# Patient Record
Sex: Female | Born: 1975 | Race: Asian | Hispanic: No | Marital: Married | Smoking: Never smoker
Health system: Southern US, Community
[De-identification: ages and names within clinical notes are randomized; demographics above are authoritative.]

## PROBLEM LIST (undated history)

## (undated) DIAGNOSIS — G5603 Carpal tunnel syndrome, bilateral upper limbs: Secondary | ICD-10-CM

## (undated) DIAGNOSIS — N841 Polyp of cervix uteri: Secondary | ICD-10-CM

## (undated) DIAGNOSIS — R519 Headache, unspecified: Secondary | ICD-10-CM

## (undated) DIAGNOSIS — F32A Depression, unspecified: Secondary | ICD-10-CM

## (undated) DIAGNOSIS — J45909 Unspecified asthma, uncomplicated: Secondary | ICD-10-CM

## (undated) DIAGNOSIS — G43909 Migraine, unspecified, not intractable, without status migrainosus: Secondary | ICD-10-CM

## (undated) DIAGNOSIS — M1990 Unspecified osteoarthritis, unspecified site: Secondary | ICD-10-CM

## (undated) DIAGNOSIS — R7309 Other abnormal glucose: Secondary | ICD-10-CM

## (undated) DIAGNOSIS — O24419 Gestational diabetes mellitus in pregnancy, unspecified control: Secondary | ICD-10-CM

## (undated) DIAGNOSIS — G5621 Lesion of ulnar nerve, right upper limb: Secondary | ICD-10-CM

## (undated) DIAGNOSIS — D649 Anemia, unspecified: Secondary | ICD-10-CM

## (undated) DIAGNOSIS — T8859XA Other complications of anesthesia, initial encounter: Secondary | ICD-10-CM

## (undated) DIAGNOSIS — E7800 Pure hypercholesterolemia, unspecified: Secondary | ICD-10-CM

## (undated) DIAGNOSIS — O139 Gestational [pregnancy-induced] hypertension without significant proteinuria, unspecified trimester: Secondary | ICD-10-CM

## (undated) DIAGNOSIS — N979 Female infertility, unspecified: Secondary | ICD-10-CM

## (undated) DIAGNOSIS — F419 Anxiety disorder, unspecified: Secondary | ICD-10-CM

## (undated) DIAGNOSIS — K219 Gastro-esophageal reflux disease without esophagitis: Secondary | ICD-10-CM

## (undated) HISTORY — DX: Gestational diabetes mellitus in pregnancy, unspecified control: O24.419

## (undated) HISTORY — DX: Other abnormal glucose: R73.09

## (undated) HISTORY — PX: PILONIDAL CYST EXCISION: SHX744

## (undated) HISTORY — DX: Headache, unspecified: R51.9

## (undated) HISTORY — DX: Pure hypercholesterolemia, unspecified: E78.00

## (undated) HISTORY — DX: Migraine, unspecified, not intractable, without status migrainosus: G43.909

## (undated) HISTORY — DX: Gastro-esophageal reflux disease without esophagitis: K21.9

## (undated) HISTORY — DX: Depression, unspecified: F32.A

## (undated) HISTORY — DX: Anxiety disorder, unspecified: F41.9

## (undated) HISTORY — DX: Unspecified asthma, uncomplicated: J45.909

## (undated) HISTORY — PX: OTHER SURGICAL HISTORY: SHX169

---

## 2007-09-23 HISTORY — PX: LAPAROSCOPIC CHOLECYSTECTOMY: SUR755

## 2012-09-22 HISTORY — PX: ELBOW ARTHROSCOPY: SUR87

## 2013-09-22 HISTORY — PX: ELBOW ARTHROSCOPY: SUR87

## 2015-08-13 ENCOUNTER — Ambulatory Visit: Payer: Self-pay | Admitting: Physician Assistant

## 2015-08-13 ENCOUNTER — Encounter: Payer: Self-pay | Admitting: Physician Assistant

## 2015-08-13 VITALS — BP 122/82 | HR 78 | Temp 98.6°F

## 2015-08-13 DIAGNOSIS — J069 Acute upper respiratory infection, unspecified: Secondary | ICD-10-CM

## 2015-08-13 MED ORDER — AZITHROMYCIN 250 MG PO TABS: ORAL_TABLET | ORAL | Status: DC

## 2015-08-13 MED ORDER — HYDROCOD POLST-CPM POLST ER 10-8 MG/5ML PO SUER: 5.0000 mL | Freq: Two times a day (BID) | ORAL | Status: DC | PRN

## 2015-08-13 NOTE — Progress Notes (Signed)
S: C/o runny nose and congestion for 3 days, sore throat, cough which is keeping her up,  no fever, chills, cp/sob, v/d; mucus was green this am but clear throughout the day, cough is sporadic,   Using otc meds without relief  O: PE: perrl eomi, normocephalic, tms dull, nasal mucosa red and swollen, throat injected, neck supple no lymph, lungs c t a, cv rrr, neuro intact  A:  Acute  uri   P: zpack, tussionex 179ml nrdrink fluids, continue regular meds , use otc meds of choice, return if not improving in 5 days, return earlier if worsening

## 2015-09-15 ENCOUNTER — Emergency Department
Admission: EM | Admit: 2015-09-15 | Discharge: 2015-09-15 | Disposition: A | Discharge status: Home / Self Care | Payer: 59 | Attending: Emergency Medicine | Admitting: Emergency Medicine

## 2015-09-15 ENCOUNTER — Encounter: Payer: Self-pay | Admitting: Emergency Medicine

## 2015-09-15 DIAGNOSIS — O209 Hemorrhage in early pregnancy, unspecified: Secondary | ICD-10-CM | POA: Diagnosis present

## 2015-09-15 DIAGNOSIS — Z79899 Other long term (current) drug therapy: Secondary | ICD-10-CM | POA: Insufficient documentation

## 2015-09-15 DIAGNOSIS — O039 Complete or unspecified spontaneous abortion without complication: Secondary | ICD-10-CM | POA: Insufficient documentation

## 2015-09-15 DIAGNOSIS — Z7951 Long term (current) use of inhaled steroids: Secondary | ICD-10-CM | POA: Insufficient documentation

## 2015-09-15 DIAGNOSIS — Z3A01 Less than 8 weeks gestation of pregnancy: Secondary | ICD-10-CM | POA: Insufficient documentation

## 2015-09-15 LAB — URINALYSIS COMPLETE WITH MICROSCOPIC (ARMC ONLY)
BILIRUBIN URINE: NEGATIVE
GLUCOSE, UA: NEGATIVE mg/dL
Ketones, ur: NEGATIVE mg/dL
LEUKOCYTES UA: NEGATIVE
NITRITE: NEGATIVE
PH: 6 (ref 5.0–8.0)
Protein, ur: NEGATIVE mg/dL
Specific Gravity, Urine: 1.01 (ref 1.005–1.030)

## 2015-09-15 LAB — CBC WITH DIFFERENTIAL/PLATELET
BASOS PCT: 1 %
Basophils Absolute: 0 10*3/uL (ref 0–0.1)
Eosinophils Absolute: 0.2 10*3/uL (ref 0–0.7)
Eosinophils Relative: 2 %
HEMATOCRIT: 34 % — AB (ref 35.0–47.0)
HEMOGLOBIN: 11.4 g/dL — AB (ref 12.0–16.0)
Lymphocytes Relative: 19 %
Lymphs Abs: 1.3 10*3/uL (ref 1.0–3.6)
MCH: 26.7 pg (ref 26.0–34.0)
MCHC: 33.6 g/dL (ref 32.0–36.0)
MCV: 79.4 fL — ABNORMAL LOW (ref 80.0–100.0)
MONOS PCT: 8 %
Monocytes Absolute: 0.6 10*3/uL (ref 0.2–0.9)
NEUTROS ABS: 4.9 10*3/uL (ref 1.4–6.5)
NEUTROS PCT: 70 %
Platelets: 366 10*3/uL (ref 150–440)
RBC: 4.29 MIL/uL (ref 3.80–5.20)
RDW: 15.1 % — ABNORMAL HIGH (ref 11.5–14.5)
WBC: 7 10*3/uL (ref 3.6–11.0)

## 2015-09-15 LAB — BASIC METABOLIC PANEL
ANION GAP: 6 (ref 5–15)
BUN: 12 mg/dL (ref 6–20)
CHLORIDE: 108 mmol/L (ref 101–111)
CO2: 25 mmol/L (ref 22–32)
CREATININE: 0.66 mg/dL (ref 0.44–1.00)
Calcium: 9 mg/dL (ref 8.9–10.3)
GFR calc non Af Amer: >60 mL/min (ref >60)
Glucose, Bld: 102 mg/dL — ABNORMAL HIGH (ref 65–99)
POTASSIUM: 4.1 mmol/L (ref 3.5–5.1)
Sodium: 139 mmol/L (ref 135–145)

## 2015-09-15 LAB — ABO/RH: ABO/RH(D): A POS

## 2015-09-15 LAB — HCG, QUANTITATIVE, PREGNANCY: hCG, Beta Chain, Quant, S: 240 m[IU]/mL — ABNORMAL HIGH (ref <5)

## 2015-09-15 NOTE — Discharge Instructions (Signed)
Your pregnancy hormone level was 240. Your blood type is a positive. Your pee or to be having a miscarriage. We will continue to have some bleeding and cramping. He may take ibuprofen for cramping if needed. Follow-up with [facility] clinic this coming week. Return to the emergency department if you have heavier bleeding or uncontrolled pain or other urgent concerns.  Miscarriage A miscarriage is the sudden loss of an unborn baby (fetus) before the 20th week of pregnancy. Most miscarriages happen in the first 3 months of pregnancy. Sometimes, it happens before a woman even knows she is pregnant. A miscarriage is also called a "spontaneous miscarriage" or "early pregnancy loss." Having a miscarriage can be an emotional experience. Talk with your caregiver about any questions you may have about miscarrying, the grieving process, and your future pregnancy plans. CAUSES   Problems with the fetal chromosomes that make it impossible for the baby to develop normally. Problems with the baby's genes or chromosomes are most often the result of errors that occur, by chance, as the embryo divides and grows. The problems are not inherited from the parents.  Infection of the cervix or uterus.   Hormone problems.   Problems with the cervix, such as having an incompetent cervix. This is when the tissue in the cervix is not strong enough to hold the pregnancy.   Problems with the uterus, such as an abnormally shaped uterus, uterine fibroids, or congenital abnormalities.   Certain medical conditions.   Smoking, drinking alcohol, or taking illegal drugs.   Trauma.  Often, the cause of a miscarriage is unknown.  SYMPTOMS   Vaginal bleeding or spotting, with or without cramps or pain.  Pain or cramping in the abdomen or lower back.  Passing fluid, tissue, or blood clots from the vagina. DIAGNOSIS  Your caregiver will perform a physical exam. You may also have an ultrasound to confirm the miscarriage.  Blood or urine tests may also be ordered. TREATMENT   Sometimes, treatment is not necessary if you naturally pass all the fetal tissue that was in the uterus. If some of the fetus or placenta remains in the body (incomplete miscarriage), tissue left behind may become infected and must be removed. Usually, a dilation and curettage (D and C) procedure is performed. During a D and C procedure, the cervix is widened (dilated) and any remaining fetal or placental tissue is gently removed from the uterus.  Antibiotic medicines are prescribed if there is an infection. Other medicines may be given to reduce the size of the uterus (contract) if there is a lot of bleeding.  If you have Rh negative blood and your baby was Rh positive, you will need a Rh immunoglobulin shot. This shot will protect any future baby from having Rh blood problems in future pregnancies. HOME CARE INSTRUCTIONS   Your caregiver may order bed rest or may allow you to continue light activity. Resume activity as directed by your caregiver.  Have someone help with home and family responsibilities during this time.   Keep track of the number of sanitary pads you use each day and how soaked (saturated) they are. Write down this information.   Do not use tampons. Do not douche or have sexual intercourse until approved by your caregiver.   Only take over-the-counter or prescription medicines for pain or discomfort as directed by your caregiver.   Do not take aspirin. Aspirin can cause bleeding.   Keep all follow-up appointments with your caregiver.   If you or  your partner have problems with grieving, talk to your caregiver or seek counseling to help cope with the pregnancy loss. Allow enough time to grieve before trying to get pregnant again.  SEEK IMMEDIATE MEDICAL CARE IF:   You have severe cramps or pain in your back or abdomen.  You have a fever.  You pass large blood clots (walnut-sized or larger) ortissue from  your vagina. Save any tissue for your caregiver to inspect.   Your bleeding increases.   You have a thick, bad-smelling vaginal discharge.  You become lightheaded, weak, or you faint.   You have chills.  MAKE SURE YOU:  Understand these instructions.  Will watch your condition.  Will get help right away if you are not doing well or get worse.   This information is not intended to replace advice given to you by your health care provider. Make sure you discuss any questions you have with your health care provider.   Document Released: 03/04/2001 Document Revised: 01/03/2013 Document Reviewed: 10/28/2011 Elsevier Interactive Patient Education [employer].

## 2015-09-15 NOTE — ED Notes (Signed)
Pt states she is 3-[redacted] weeks pregnant and started having vaginal bleeding with lower abd. Cramping this am

## 2015-09-15 NOTE — ED Provider Notes (Signed)
[facility] Emergency Department Provider Note  ____________________________________________  Time seen: 8:30 AM  I have reviewed the triage vital signs and the nursing notes.  History by:  Patient and her husband  HISTORY  Chief Complaint Vaginal Bleeding     HPI Donna Fowler is a 39 y.o. female who resents emergency department with vaginal bleeding and report that she is 3-4 weeks. Her last menstrual period was November 10, which would give a gestational age of approximately 38 weeks, however she was seen at [facility] on Thursday due to spotting with this current pregnancy. At that time, she had an hCG level drawn which was approximately 370 and she had an ultrasound. She was told that the ultrasound and hCG gave an estimated gestational age of 3-4 weeks. This morning, she had increased bleeding, the same as menstrual period, with some cramping which has been intermittent. The patient is understandably fearful that she is having a miscarriage.    History reviewed. No pertinent past medical history.  There are no active problems to display for this patient.   Past Surgical History  Procedure Laterality Date  . Cholecystectomy      Current Outpatient Rx  Name  Route  Sig  Dispense  Refill  . albuterol (PROVENTIL HFA;VENTOLIN HFA) 108 (90 BASE) MCG/ACT inhaler   Inhalation   Inhale into the lungs every 6 (six) hours as needed for wheezing or shortness of breath.         . ALPRAZolam (XANAX) 0.25 MG tablet   Oral   Take by mouth.         Marland Kitchen atorvastatin (LIPITOR) 10 MG tablet   Oral   Take 10 mg by mouth daily.         Marland Kitchen azithromycin (ZITHROMAX Z-PAK) 250 MG tablet      2 pills today then 1 pill qd for 4 d   6 each   0     rx written   . cetirizine (ZYRTEC) 5 MG tablet   Oral   Take 5 mg by mouth daily.         . chlorpheniramine-HYDROcodone (TUSSIONEX PENNKINETIC ER) 10-8 MG/5ML SUER   Oral   Take 5 mLs by mouth every 12  (twelve) hours as needed for cough.   150 mL   0     rx written   . FLUoxetine (PROZAC) 10 MG capsule   Oral   Take 10 mg by mouth daily.         . fluticasone (FLONASE) 50 MCG/ACT nasal spray   Nasal   Place into the nose.         . pantoprazole (PROTONIX) 20 MG tablet   Oral   Take 20 mg by mouth daily.         Marland Kitchen topiramate (TOPAMAX) 25 MG tablet   Oral   Take 25 mg by mouth 2 (two) times daily.           Allergies Review of patient's allergies indicates no known allergies.  No family history on file.  Social History Social History  Substance Use Topics  . Smoking status: Never Smoker   . Smokeless tobacco: None  . Alcohol Use: No    Review of Systems  Constitutional: Negative for fever/chills. ENT: Negative for congestion. Cardiovascular: Negative for chest pain. Respiratory: Negative for cough. Gastrointestinal: Negative for abdominal pain, vomiting and diarrhea. Genitourinary: 6 weeks gravid by dates, vaginal bleeding. See history of present illness Musculoskeletal: No back pain.  Skin: Negative for rash. Neurological: Negative for headache or focal weakness   10-point ROS otherwise negative.  ____________________________________________   PHYSICAL EXAM:  VITAL SIGNS: ED Triage Vitals  Enc Vitals Group     BP 09/15/15 0713 134/86 mmHg     Pulse Rate 09/15/15 0713 81     Resp 09/15/15 0713 18     Temp 09/15/15 0713 98 F (36.7 C)     Temp Source 09/15/15 0713 Oral     SpO2 09/15/15 0713 100 %     Weight 09/15/15 0713 180 lb (81.647 kg)     Height 09/15/15 0713 5\' 2"  (1.575 m)     Head Cir --      Peak Flow --      Pain Score 09/15/15 0717 7     Pain Loc --      Pain Edu? --      Excl. in [place]?Well appearing and in no distress --     Constitutional:  Alert and oriented. Well appearing, but tearful, and in no distress. ENT   Head: Normocephalic and atraumatic. Cardiovascular: Normal rate, regular rhythm, no murmur  noted Respiratory:  Normal respiratory effort, no tachypnea.    Breath sounds are clear and equal bilaterally.  Gastrointestinal: Soft, no distention. Nontender Musculoskeletal: No deformity noted. Nontender with normal range of motion in all extremities.  No noted edema. Neurologic:  Communicative. Normal appearing spontaneous movement in all 4 extremities. No gross focal neurologic deficits are appreciated.  Skin:  Skin is warm, dry. No rash noted. Psychiatric: Mood and affect are normal. Speech and behavior are normal.  ____________________________________________    LABS (pertinent positives/negatives)  Labs Reviewed  HCG, QUANTITATIVE, PREGNANCY - Abnormal; Notable for the following:    hCG, Beta Chain, Quant, S 240 (*)    All other components within normal limits  URINALYSIS COMPLETEWITH MICROSCOPIC ([facility] ONLY) - Abnormal; Notable for the following:    Color, Urine STRAW (*)    APPearance CLEAR (*)    Hgb urine dipstick 3+ (*)    Bacteria, UA RARE (*)    Squamous Epithelial / LPF 0-5 (*)    All other components within normal limits  CBC WITH DIFFERENTIAL/PLATELET  BASIC METABOLIC PANEL  ABO/RH     ____________________________________________ ____________________________________________   INITIAL IMPRESSION / ASSESSMENT AND PLAN / ED COURSE  Pertinent labs & imaging results that were available during my care of the patient were reviewed by me and considered in my medical decision making (see chart for details).  39 year old female with suspected miscarriage, or at least a threatened miscarriage. She had an hCG level of 370 at [facility] on Thursday. She had an ultrasound that showed a gestational sac with a gestational age of 3-4 weeks.  Laboratory results show an hCG level today of 240. She has a blood type of a positive.  I have counseled the patient on the significance of the hCG level and the likelihood that this is a miscarriage, consistent with her current  symptoms. She is thoughtful, tearful, and appropriate for the circumstance. She understands the diagnosis.  Given that the patient just had an ultrasound on Thursday, I do not see an indication for repeat ultrasound. I've offered one and discussed this with the patient, but she and her husband agree.  ____________________________________________   FINAL CLINICAL IMPRESSION(S) / ED DIAGNOSES  Final diagnoses:  Miscarriage      Ahmed Prima, MD 09/15/15 [phone]

## 2015-09-15 NOTE — ED Notes (Signed)
MD at bedside. 

## 2015-09-23 HISTORY — PX: COLONOSCOPY WITH ESOPHAGOGASTRODUODENOSCOPY (EGD): SHX5779

## 2015-09-25 DIAGNOSIS — N840 Polyp of corpus uteri: Secondary | ICD-10-CM | POA: Diagnosis not present

## 2015-09-25 DIAGNOSIS — O200 Threatened abortion: Secondary | ICD-10-CM | POA: Diagnosis not present

## 2015-11-07 DIAGNOSIS — K219 Gastro-esophageal reflux disease without esophagitis: Secondary | ICD-10-CM | POA: Diagnosis not present

## 2015-11-07 DIAGNOSIS — E782 Mixed hyperlipidemia: Secondary | ICD-10-CM | POA: Diagnosis not present

## 2015-11-07 DIAGNOSIS — Z6834 Body mass index (BMI) 34.0-34.9, adult: Secondary | ICD-10-CM | POA: Diagnosis not present

## 2015-11-07 DIAGNOSIS — J309 Allergic rhinitis, unspecified: Secondary | ICD-10-CM | POA: Diagnosis not present

## 2015-12-26 ENCOUNTER — Ambulatory Visit: Payer: Self-pay | Admitting: Physician Assistant

## 2015-12-26 ENCOUNTER — Encounter: Payer: Self-pay | Admitting: Physician Assistant

## 2015-12-26 VITALS — BP 120/82 | HR 76 | Temp 98.7°F

## 2015-12-26 DIAGNOSIS — J069 Acute upper respiratory infection, unspecified: Secondary | ICD-10-CM

## 2015-12-26 MED ORDER — CEFDINIR 300 MG PO CAPS: 300.0000 mg | ORAL_CAPSULE | Freq: Two times a day (BID) | ORAL | Status: DC

## 2015-12-26 MED ORDER — HYDROCOD POLST-CPM POLST ER 10-8 MG/5ML PO SUER: 5.0000 mL | Freq: Two times a day (BID) | ORAL | Status: DC | PRN

## 2015-12-26 MED ORDER — BENZONATATE 200 MG PO CAPS: 200.0000 mg | ORAL_CAPSULE | Freq: Three times a day (TID) | ORAL | Status: DC | PRN

## 2015-12-26 NOTE — Progress Notes (Signed)
S: C/o cough and congestion with green mucus,  chest is sore from coughing, denies  fever, chills, states  cough is  keeping her awake at night;  denies cardiac type chest pain or sob, v/d, abd pain Remainder ros neg, sx for 4 days  O: vitals wnl, nad, tms clear, throat injected, neck supple no lymph, lungs c t a, cv rrr, cough is dry and hacking,  neuro intact  A:  Acute bronchitis   P:  rx medication: omnicef, tussionex for night time cough, tessalon perls during the day while at work, use otc meds, tylenol or motrin as needed for fever/chills, return if not better in 3 -5 days, return earlier if worsening

## 2015-12-31 DIAGNOSIS — D259 Leiomyoma of uterus, unspecified: Secondary | ICD-10-CM | POA: Diagnosis not present

## 2015-12-31 DIAGNOSIS — R5383 Other fatigue: Secondary | ICD-10-CM | POA: Diagnosis not present

## 2015-12-31 DIAGNOSIS — N83209 Unspecified ovarian cyst, unspecified side: Secondary | ICD-10-CM | POA: Diagnosis not present

## 2015-12-31 DIAGNOSIS — N840 Polyp of corpus uteri: Secondary | ICD-10-CM | POA: Diagnosis not present

## 2016-01-07 DIAGNOSIS — N970 Female infertility associated with anovulation: Secondary | ICD-10-CM | POA: Diagnosis not present

## 2016-01-07 DIAGNOSIS — N840 Polyp of corpus uteri: Secondary | ICD-10-CM | POA: Diagnosis not present

## 2016-01-25 DIAGNOSIS — N970 Female infertility associated with anovulation: Secondary | ICD-10-CM | POA: Diagnosis not present

## 2016-02-05 DIAGNOSIS — H524 Presbyopia: Secondary | ICD-10-CM | POA: Diagnosis not present

## 2016-02-05 DIAGNOSIS — H5213 Myopia, bilateral: Secondary | ICD-10-CM | POA: Diagnosis not present

## 2016-02-12 DIAGNOSIS — N970 Female infertility associated with anovulation: Secondary | ICD-10-CM | POA: Diagnosis not present

## 2016-02-26 DIAGNOSIS — N978 Female infertility of other origin: Secondary | ICD-10-CM | POA: Diagnosis not present

## 2016-02-26 DIAGNOSIS — E039 Hypothyroidism, unspecified: Secondary | ICD-10-CM | POA: Diagnosis not present

## 2016-03-26 DIAGNOSIS — E039 Hypothyroidism, unspecified: Secondary | ICD-10-CM | POA: Diagnosis not present

## 2016-04-01 DIAGNOSIS — N979 Female infertility, unspecified: Secondary | ICD-10-CM | POA: Diagnosis not present

## 2016-04-08 DIAGNOSIS — N979 Female infertility, unspecified: Secondary | ICD-10-CM | POA: Diagnosis not present

## 2016-04-09 DIAGNOSIS — Z1231 Encounter for screening mammogram for malignant neoplasm of breast: Secondary | ICD-10-CM | POA: Diagnosis not present

## 2016-04-09 DIAGNOSIS — F411 Generalized anxiety disorder: Secondary | ICD-10-CM | POA: Diagnosis not present

## 2016-04-09 DIAGNOSIS — Z01419 Encounter for gynecological examination (general) (routine) without abnormal findings: Secondary | ICD-10-CM | POA: Diagnosis not present

## 2016-04-18 DIAGNOSIS — N978 Female infertility of other origin: Secondary | ICD-10-CM | POA: Diagnosis not present

## 2016-05-14 ENCOUNTER — Other Ambulatory Visit: Payer: Self-pay | Admitting: Obstetrics and Gynecology

## 2016-05-14 DIAGNOSIS — Z1231 Encounter for screening mammogram for malignant neoplasm of breast: Secondary | ICD-10-CM

## 2016-05-28 ENCOUNTER — Ambulatory Visit
Admission: RE | Admit: 2016-05-28 | Discharge: 2016-05-28 | Disposition: A | Discharge status: Home / Self Care | Payer: 59 | Source: Ambulatory Visit | Attending: Obstetrics and Gynecology | Admitting: Obstetrics and Gynecology

## 2016-05-28 DIAGNOSIS — Z1231 Encounter for screening mammogram for malignant neoplasm of breast: Secondary | ICD-10-CM | POA: Insufficient documentation

## 2016-07-03 DIAGNOSIS — E782 Mixed hyperlipidemia: Secondary | ICD-10-CM | POA: Diagnosis not present

## 2016-07-03 DIAGNOSIS — Z1321 Encounter for screening for nutritional disorder: Secondary | ICD-10-CM | POA: Diagnosis not present

## 2016-07-03 DIAGNOSIS — J309 Allergic rhinitis, unspecified: Secondary | ICD-10-CM | POA: Diagnosis not present

## 2016-07-03 DIAGNOSIS — K219 Gastro-esophageal reflux disease without esophagitis: Secondary | ICD-10-CM | POA: Diagnosis not present

## 2016-07-08 DIAGNOSIS — E782 Mixed hyperlipidemia: Secondary | ICD-10-CM | POA: Diagnosis not present

## 2016-07-08 DIAGNOSIS — J4520 Mild intermittent asthma, uncomplicated: Secondary | ICD-10-CM | POA: Diagnosis not present

## 2016-07-08 DIAGNOSIS — K219 Gastro-esophageal reflux disease without esophagitis: Secondary | ICD-10-CM | POA: Diagnosis not present

## 2016-07-08 DIAGNOSIS — Z8639 Personal history of other endocrine, nutritional and metabolic disease: Secondary | ICD-10-CM | POA: Diagnosis not present

## 2016-10-31 DIAGNOSIS — Z0000 Encounter for general adult medical examination without abnormal findings: Secondary | ICD-10-CM | POA: Diagnosis not present

## 2016-11-03 DIAGNOSIS — E7800 Pure hypercholesterolemia, unspecified: Secondary | ICD-10-CM | POA: Diagnosis not present

## 2016-11-03 DIAGNOSIS — K219 Gastro-esophageal reflux disease without esophagitis: Secondary | ICD-10-CM | POA: Diagnosis not present

## 2016-11-03 DIAGNOSIS — R1013 Epigastric pain: Secondary | ICD-10-CM | POA: Diagnosis not present

## 2016-11-03 DIAGNOSIS — N979 Female infertility, unspecified: Secondary | ICD-10-CM | POA: Diagnosis not present

## 2016-11-12 DIAGNOSIS — Z1159 Encounter for screening for other viral diseases: Secondary | ICD-10-CM | POA: Diagnosis not present

## 2016-11-12 DIAGNOSIS — N978 Female infertility of other origin: Secondary | ICD-10-CM | POA: Diagnosis not present

## 2016-11-12 DIAGNOSIS — N839 Noninflammatory disorder of ovary, fallopian tube and broad ligament, unspecified: Secondary | ICD-10-CM | POA: Diagnosis not present

## 2016-11-12 DIAGNOSIS — Z113 Encounter for screening for infections with a predominantly sexual mode of transmission: Secondary | ICD-10-CM | POA: Diagnosis not present

## 2016-12-19 DIAGNOSIS — Z20828 Contact with and (suspected) exposure to other viral communicable diseases: Secondary | ICD-10-CM | POA: Diagnosis not present

## 2017-01-28 DIAGNOSIS — N979 Female infertility, unspecified: Secondary | ICD-10-CM | POA: Diagnosis not present

## 2017-02-16 DIAGNOSIS — Z3200 Encounter for pregnancy test, result unknown: Secondary | ICD-10-CM | POA: Diagnosis not present

## 2017-02-18 DIAGNOSIS — Z3201 Encounter for pregnancy test, result positive: Secondary | ICD-10-CM | POA: Diagnosis not present

## 2017-04-22 DIAGNOSIS — N979 Female infertility, unspecified: Secondary | ICD-10-CM | POA: Diagnosis not present

## 2017-04-22 DIAGNOSIS — E559 Vitamin D deficiency, unspecified: Secondary | ICD-10-CM | POA: Diagnosis not present

## 2017-04-22 DIAGNOSIS — R1013 Epigastric pain: Secondary | ICD-10-CM | POA: Diagnosis not present

## 2017-04-22 DIAGNOSIS — E7800 Pure hypercholesterolemia, unspecified: Secondary | ICD-10-CM | POA: Diagnosis not present

## 2017-04-22 DIAGNOSIS — K219 Gastro-esophageal reflux disease without esophagitis: Secondary | ICD-10-CM | POA: Diagnosis not present

## 2017-05-01 DIAGNOSIS — Z0000 Encounter for general adult medical examination without abnormal findings: Secondary | ICD-10-CM | POA: Diagnosis not present

## 2017-05-01 DIAGNOSIS — H524 Presbyopia: Secondary | ICD-10-CM | POA: Diagnosis not present

## 2017-05-01 DIAGNOSIS — E7800 Pure hypercholesterolemia, unspecified: Secondary | ICD-10-CM | POA: Diagnosis not present

## 2017-05-01 DIAGNOSIS — K219 Gastro-esophageal reflux disease without esophagitis: Secondary | ICD-10-CM | POA: Diagnosis not present

## 2017-05-01 DIAGNOSIS — H5213 Myopia, bilateral: Secondary | ICD-10-CM | POA: Diagnosis not present

## 2017-05-01 DIAGNOSIS — F4323 Adjustment disorder with mixed anxiety and depressed mood: Secondary | ICD-10-CM | POA: Diagnosis not present

## 2017-05-13 DIAGNOSIS — Z01419 Encounter for gynecological examination (general) (routine) without abnormal findings: Secondary | ICD-10-CM | POA: Diagnosis not present

## 2017-05-13 DIAGNOSIS — Z6833 Body mass index (BMI) 33.0-33.9, adult: Secondary | ICD-10-CM | POA: Diagnosis not present

## 2017-07-23 ENCOUNTER — Other Ambulatory Visit: Payer: Self-pay | Admitting: Internal Medicine

## 2017-07-23 DIAGNOSIS — Z1231 Encounter for screening mammogram for malignant neoplasm of breast: Secondary | ICD-10-CM

## 2017-08-11 ENCOUNTER — Ambulatory Visit
Admission: RE | Admit: 2017-08-11 | Discharge: 2017-08-11 | Disposition: A | Discharge status: Home / Self Care | Payer: 59 | Source: Ambulatory Visit | Attending: Internal Medicine | Admitting: Internal Medicine

## 2017-08-11 DIAGNOSIS — Z1231 Encounter for screening mammogram for malignant neoplasm of breast: Secondary | ICD-10-CM | POA: Diagnosis not present

## 2017-09-08 DIAGNOSIS — G5602 Carpal tunnel syndrome, left upper limb: Secondary | ICD-10-CM | POA: Diagnosis not present

## 2017-09-08 DIAGNOSIS — G5601 Carpal tunnel syndrome, right upper limb: Secondary | ICD-10-CM | POA: Diagnosis not present

## 2017-09-08 DIAGNOSIS — M7711 Lateral epicondylitis, right elbow: Secondary | ICD-10-CM | POA: Diagnosis not present

## 2017-09-08 DIAGNOSIS — M7712 Lateral epicondylitis, left elbow: Secondary | ICD-10-CM | POA: Diagnosis not present

## 2017-09-08 DIAGNOSIS — M7701 Medial epicondylitis, right elbow: Secondary | ICD-10-CM | POA: Diagnosis not present

## 2017-09-28 DIAGNOSIS — E7800 Pure hypercholesterolemia, unspecified: Secondary | ICD-10-CM | POA: Diagnosis not present

## 2017-09-28 DIAGNOSIS — Z6833 Body mass index (BMI) 33.0-33.9, adult: Secondary | ICD-10-CM | POA: Diagnosis not present

## 2017-09-28 DIAGNOSIS — F4323 Adjustment disorder with mixed anxiety and depressed mood: Secondary | ICD-10-CM | POA: Diagnosis not present

## 2017-09-28 DIAGNOSIS — K219 Gastro-esophageal reflux disease without esophagitis: Secondary | ICD-10-CM | POA: Diagnosis not present

## 2017-09-28 DIAGNOSIS — Z0000 Encounter for general adult medical examination without abnormal findings: Secondary | ICD-10-CM | POA: Diagnosis not present

## 2017-10-05 DIAGNOSIS — R1013 Epigastric pain: Secondary | ICD-10-CM | POA: Diagnosis not present

## 2017-10-05 DIAGNOSIS — K219 Gastro-esophageal reflux disease without esophagitis: Secondary | ICD-10-CM | POA: Diagnosis not present

## 2017-10-05 DIAGNOSIS — E7800 Pure hypercholesterolemia, unspecified: Secondary | ICD-10-CM | POA: Diagnosis not present

## 2017-10-05 DIAGNOSIS — Z0000 Encounter for general adult medical examination without abnormal findings: Secondary | ICD-10-CM | POA: Diagnosis not present

## 2017-10-19 DIAGNOSIS — R1013 Epigastric pain: Secondary | ICD-10-CM | POA: Diagnosis not present

## 2017-11-04 DIAGNOSIS — G5602 Carpal tunnel syndrome, left upper limb: Secondary | ICD-10-CM | POA: Diagnosis not present

## 2017-11-04 DIAGNOSIS — M7710 Lateral epicondylitis, unspecified elbow: Secondary | ICD-10-CM | POA: Diagnosis not present

## 2017-11-04 DIAGNOSIS — G5601 Carpal tunnel syndrome, right upper limb: Secondary | ICD-10-CM | POA: Diagnosis not present

## 2017-11-16 DIAGNOSIS — G5602 Carpal tunnel syndrome, left upper limb: Secondary | ICD-10-CM | POA: Diagnosis not present

## 2017-11-16 DIAGNOSIS — G5601 Carpal tunnel syndrome, right upper limb: Secondary | ICD-10-CM | POA: Insufficient documentation

## 2017-11-26 DIAGNOSIS — N951 Menopausal and female climacteric states: Secondary | ICD-10-CM | POA: Diagnosis not present

## 2017-12-10 DIAGNOSIS — M25521 Pain in right elbow: Secondary | ICD-10-CM | POA: Diagnosis not present

## 2017-12-10 DIAGNOSIS — M25522 Pain in left elbow: Secondary | ICD-10-CM | POA: Diagnosis not present

## 2017-12-10 DIAGNOSIS — G5601 Carpal tunnel syndrome, right upper limb: Secondary | ICD-10-CM | POA: Diagnosis not present

## 2017-12-10 DIAGNOSIS — G5602 Carpal tunnel syndrome, left upper limb: Secondary | ICD-10-CM | POA: Diagnosis not present

## 2018-01-25 DIAGNOSIS — Z3161 Procreative counseling and advice using natural family planning: Secondary | ICD-10-CM | POA: Diagnosis not present

## 2018-01-29 DIAGNOSIS — E7800 Pure hypercholesterolemia, unspecified: Secondary | ICD-10-CM | POA: Diagnosis not present

## 2018-01-29 DIAGNOSIS — Z0000 Encounter for general adult medical examination without abnormal findings: Secondary | ICD-10-CM | POA: Diagnosis not present

## 2018-01-29 DIAGNOSIS — K219 Gastro-esophageal reflux disease without esophagitis: Secondary | ICD-10-CM | POA: Diagnosis not present

## 2018-01-29 DIAGNOSIS — Z131 Encounter for screening for diabetes mellitus: Secondary | ICD-10-CM | POA: Diagnosis not present

## 2018-01-29 DIAGNOSIS — Z6834 Body mass index (BMI) 34.0-34.9, adult: Secondary | ICD-10-CM | POA: Diagnosis not present

## 2018-01-29 DIAGNOSIS — R7989 Other specified abnormal findings of blood chemistry: Secondary | ICD-10-CM | POA: Diagnosis not present

## 2018-02-05 DIAGNOSIS — E7800 Pure hypercholesterolemia, unspecified: Secondary | ICD-10-CM | POA: Diagnosis not present

## 2018-02-05 DIAGNOSIS — M25532 Pain in left wrist: Secondary | ICD-10-CM | POA: Diagnosis not present

## 2018-02-05 DIAGNOSIS — M25531 Pain in right wrist: Secondary | ICD-10-CM | POA: Diagnosis not present

## 2018-02-05 DIAGNOSIS — G5601 Carpal tunnel syndrome, right upper limb: Secondary | ICD-10-CM | POA: Diagnosis not present

## 2018-02-05 DIAGNOSIS — K219 Gastro-esophageal reflux disease without esophagitis: Secondary | ICD-10-CM | POA: Diagnosis not present

## 2018-02-05 DIAGNOSIS — G5602 Carpal tunnel syndrome, left upper limb: Secondary | ICD-10-CM | POA: Diagnosis not present

## 2018-02-05 DIAGNOSIS — Z6834 Body mass index (BMI) 34.0-34.9, adult: Secondary | ICD-10-CM | POA: Diagnosis not present

## 2018-02-05 DIAGNOSIS — N979 Female infertility, unspecified: Secondary | ICD-10-CM | POA: Diagnosis not present

## 2018-04-28 ENCOUNTER — Encounter: Payer: Self-pay | Admitting: Family Medicine

## 2018-04-28 ENCOUNTER — Ambulatory Visit (INDEPENDENT_AMBULATORY_CARE_PROVIDER_SITE_OTHER): Payer: Self-pay | Admitting: Family Medicine

## 2018-04-28 VITALS — BP 110/82 | HR 80 | Temp 98.2°F | Wt 188.0 lb

## 2018-04-28 DIAGNOSIS — L0291 Cutaneous abscess, unspecified: Secondary | ICD-10-CM

## 2018-04-28 MED ORDER — DOXYCYCLINE HYCLATE 100 MG PO CAPS: 100.0000 mg | ORAL_CAPSULE | Freq: Two times a day (BID) | ORAL | 0 refills | Status: DC

## 2018-04-28 NOTE — Patient Instructions (Addendum)
I recommend keeping the area covered with gauze for the next 24 hours to absorb any residual drainage.  You may apply Bactroban 2-3 times daily as needed.  I am prescribing doxycyline 100 mg twice daily to resolve skin infection.   If symptoms worsen or do not improve, please return for follow-up.    Skin Abscess A skin abscess is an infected area on or under your skin that contains pus and other material. An abscess can happen almost anywhere on your body. Some abscesses break open (rupture) on their own. Most continue to get worse unless they are treated. The infection can spread deeper into the body and into your blood, which can make you feel sick. Treatment usually involves draining the abscess. Follow these instructions at home: Abscess Care  If you have an abscess that has not drained, place a warm, clean, wet washcloth over the abscess several times a day. Do this as told by your doctor.  Follow instructions from your doctor about how to take care of your abscess. Make sure you: ? Cover the abscess with a bandage (dressing). ? Change your bandage or gauze as told by your doctor. ? Wash your hands with soap and water before you change the bandage or gauze. If you cannot use soap and water, use hand sanitizer.  Check your abscess every day for signs that the infection is getting worse. Check for: ? More redness, swelling, or pain. ? More fluid or blood. ? Warmth. ? More pus or a bad smell. Medicines   Take over-the-counter and prescription medicines only as told by your doctor.  If you were prescribed an antibiotic medicine, take it as told by your doctor. Do not stop taking the antibiotic even if you start to feel better. General instructions  To avoid spreading the infection: ? Do not share personal care items, towels, or hot tubs with others. ? Avoid making skin-to-skin contact with other people.  Keep all follow-up visits as told by your doctor. This is  important. Contact a doctor if:  You have more redness, swelling, or pain around your abscess.  You have more fluid or blood coming from your abscess.  Your abscess feels warm when you touch it.  You have more pus or a bad smell coming from your abscess.  You have a fever.  Your muscles ache.  You have chills.  You feel sick. Get help right away if:  You have very bad (severe) pain.  You see red streaks on your skin spreading away from the abscess. This information is not intended to replace advice given to you by your health care provider. Make sure you discuss any questions you have with your health care provider. Document Released: 02/25/2008 Document Revised: 05/04/2016 Document Reviewed: 07/18/2015 Elsevier Interactive Patient Education  [employer].

## 2018-04-28 NOTE — Progress Notes (Signed)
Patient ID: Donna Fowler, female    DOB: Aug 17, 1976, 42 y.o.   MRN: [id number]  PCP: Tracie Harrier, MD  Chief Complaint  Patient presents with  . Abscess    Subjective:  HPI Donna Fowler is a 42 y.o. female presents for evaluation of abscess of labia majora (right). Noticed what appeared to be a bump 3 days ago which has increased in diameter, induration, and now is severely tender to touch. Reports pain with contact of clothing. Notes also feels ill without specific symptoms present. No prior history of recurrent skin infections or abscess. Social History   Socioeconomic History  . Marital status: Married    Spouse name: Not on file  . Number of children: Not on file  . Years of education: Not on file  . Highest education level: Not on file  Occupational History  . Not on file  Social Needs  . Financial resource strain: Not on file  . Food insecurity:    Worry: Not on file    Inability: Not on file  . Transportation needs:    Medical: Not on file    Non-medical: Not on file  Tobacco Use  . Smoking status: Never Smoker  . Smokeless tobacco: Never Used  Substance and Sexual Activity  . Alcohol use: No    Alcohol/week: 0.0 oz  . Drug use: Not on file  . Sexual activity: Not on file  Lifestyle  . Physical activity:    Days per week: Not on file    Minutes per session: Not on file  . Stress: Not on file  Relationships  . Social connections:    Talks on phone: Not on file    Gets together: Not on file    Attends religious service: Not on file    Active member of club or organization: Not on file    Attends meetings of clubs or organizations: Not on file    Relationship status: Not on file  . Intimate partner violence:    Fear of current or ex partner: Not on file    Emotionally abused: Not on file    Physically abused: Not on file    Forced sexual activity: Not on file  Other Topics Concern  . Not on file  Social History Narrative  . Not on file     Family History  Problem Relation Age of Onset  . Breast cancer Neg Hx      Review of Systems Pertinent negatives listed in HPI There are no active problems to display for this patient.   No Known Allergies  Prior to Admission medications   Medication Sig Start Date End Date Taking? Authorizing Provider  acetaminophen (TYLENOL) 650 MG CR tablet Take by mouth.   Yes [provider]  albuterol (PROVENTIL HFA;VENTOLIN HFA) 108 (90 BASE) MCG/ACT inhaler Inhale 2 puffs into the lungs every 6 (six) hours as needed for wheezing or shortness of breath.    Yes [provider]  atorvastatin (LIPITOR) 10 MG tablet Take 10 mg by mouth daily.   Yes [provider]  busPIRone (BUSPAR) 15 MG tablet buspirone 15 mg tablet 10/05/17  Yes [provider]  cetirizine (ZYRTEC) 10 MG tablet Take 10 mg by mouth daily.   Yes [provider]  Cholecalciferol (VITAMIN D3) 1000 units CAPS Vitamin D3   Yes [provider]  FLUoxetine (PROZAC) 10 MG capsule Take 10 mg by mouth daily.   Yes [provider]  fluticasone (FLONASE) 50 MCG/ACT nasal  spray Place 1 spray into the nose daily as needed for allergies or rhinitis.  05/02/15  Yes [provider]  pantoprazole (PROTONIX) 40 MG tablet Take 40 mg by mouth daily.   Yes [provider]  sertraline (ZOLOFT) 50 MG tablet sertraline 50 mg tablet 10/05/17  Yes [provider]  ALPRAZolam (XANAX) 0.25 MG tablet Take 0.25 mg by mouth daily as needed for anxiety or sleep.  05/02/15   [provider]  azithromycin (ZITHROMAX Z-PAK) 250 MG tablet 2 pills today then 1 pill qd for 4 d Patient not taking: Reported on 12/26/2015 08/13/15   Versie Starks, PA-C  benzonatate (TESSALON) 200 MG capsule Take 1 capsule (200 mg total) by mouth 3 (three) times daily as needed for cough. Patient not taking: Reported on 04/28/2018 12/26/15   Versie Starks, PA-C  cefdinir (OMNICEF) 300 MG  capsule Take 1 capsule (300 mg total) by mouth 2 (two) times daily. Patient not taking: Reported on 04/28/2018 12/26/15   Versie Starks, PA-C  chlorpheniramine-HYDROcodone [facility] PENNKINETIC ER) 10-8 MG/5ML SUER Take 5 mLs by mouth every 12 (twelve) hours as needed for cough. Patient not taking: Reported on 04/28/2018 12/26/15   Versie Starks, PA-C  doxycycline (VIBRAMYCIN) 100 MG capsule Take 1 capsule (100 mg total) by mouth 2 (two) times daily. 04/28/18   Scot Jun, FNP  topiramate (TOPAMAX) 25 MG tablet Take 25 mg by mouth 2 (two) times daily as needed (for anxiety.).     [provider]    Past Medical, Surgical Family and Social History reviewed and updated.    Objective:   Today's Vitals   04/28/18 1051  BP: 110/82  Pulse: 80  Temp: 98.2 F (36.8 C)  SpO2: 99%  Weight: 188 lb (85.3 kg)  PainSc: 8   PainLoc: Groin    Wt Readings from Last 3 Encounters:  04/28/18 188 lb (85.3 kg)  09/15/15 180 lb (81.6 kg)    Physical Exam Drained Wound Abscess-Verbal informed consent obtained.  Local anesthesia used 2% with epinephrine. Cleaned with Hibiclens .Prepped with betadine.Incised with # 10  Large amount of purulent exudate mixed with serosanguineous debris was extracted. Sebaceous material was not noted within wound bed. No wound packing warranted given small diameter of incision.Patient's last menstrual period was 04/09/2018.   Assessment & Plan:  1. Abscess Patient tolerated procedure. Will start Doxycyline 100 mg BID to cover for skin infection. Patient given strict follow-up precautions of symptom worsen or abscess reoccurs.   Meds ordered this encounter  Medications  . doxycycline (VIBRAMYCIN) 100 MG capsule    Sig: Take 1 capsule (100 mg total) by mouth 2 (two) times daily.    Dispense:  20 capsule    Refill:  0    If symptoms worsen or do not improve, return for follow-up, follow-up with PCP, or at the emergency department if severity of symptoms  warrant a higher level of care.    Carroll Sage. Kenton Kingfisher, MSN, FNP-C [facility]  [address] [place], [place] [address] [phone]

## 2018-04-30 ENCOUNTER — Telehealth: Payer: Self-pay | Admitting: Emergency Medicine

## 2018-04-30 NOTE — Telephone Encounter (Signed)
Spoke with patient whom informed me she is doing so much better

## 2018-06-07 DIAGNOSIS — Z01419 Encounter for gynecological examination (general) (routine) without abnormal findings: Secondary | ICD-10-CM | POA: Diagnosis not present

## 2018-06-07 DIAGNOSIS — Z6833 Body mass index (BMI) 33.0-33.9, adult: Secondary | ICD-10-CM | POA: Diagnosis not present

## 2018-06-07 DIAGNOSIS — N841 Polyp of cervix uteri: Secondary | ICD-10-CM | POA: Diagnosis not present

## 2018-06-12 NOTE — H&P (Signed)
Donna Fowler is an 42 y.o. female with endometrial polyp noted on ultrasound 09/2015 and then on repeat in 12/2015 measuring 0.6 cm.  The patient was undergoing infertility treatment at that time with Dr. Toney Reil at [facility].  She declined surgical removal at that time.  She subsequently had an ultrasound done at an outside facility one year later which showed the same finding however we were unable to obtain the report.  Patient declines additional u/s given stability over one year and presents today for removal of endometrial polyp.  Pertinent Gynecological History: Menses: flow is moderate Bleeding: regular Contraception: abstinence DES exposure: denies Blood transfusions: none Sexually transmitted diseases: no past history Previous GYN Procedures: n/a  Last mammogram: normal Date: 06/2017 Last pap: normal Date: 04/2017 OB History: G0  Menstrual History: Menarche age: n/a No LMP recorded.    No past medical history on file.  Past Surgical History:  Procedure Laterality Date  . CHOLECYSTECTOMY      Family History  Problem Relation Age of Onset  . Breast cancer Neg Hx     Social History:  reports that she has never smoked. She has never used smokeless tobacco. She reports that she does not drink alcohol. Her drug history is not on file.  Allergies: No Known Allergies  No medications prior to admission.    ROS  There were no vitals taken for this visit. Physical Exam  Constitutional: She is oriented to person, place, and time. She appears well-developed and well-nourished.  Neck: Normal range of motion.  GI: Soft. There is no rebound and no guarding.  Neurological: She is alert and oriented to person, place, and time.  Skin: Skin is warm and dry.  Psychiatric: She has a normal mood and affect. Her behavior is normal.    No results found for this or any previous visit (from the past 24 hour(s)).  No results found.  Assessment/Plan: 42 yo with endometrial polyp and  infertility -H/S, D&C with possible Myosure -Patient has been counseled re: risk of bleeding, infection, scarring, and damage to surrounding structures.  All questions were answered and we will proceed.  Margarita Croke, [address] 06/12/2018, 8:36 PM

## 2018-06-14 ENCOUNTER — Encounter (HOSPITAL_BASED_OUTPATIENT_CLINIC_OR_DEPARTMENT_OTHER): Payer: Self-pay | Admitting: *Deleted

## 2018-06-14 ENCOUNTER — Other Ambulatory Visit: Payer: Self-pay

## 2018-06-14 NOTE — Progress Notes (Addendum)
Spoke w/ pt via phone for pre-op interview.  Npo after mn.  Arrive at 0530.  Needs cbc, t&s, and urine preg.  Will take protonix am dos w/ sips of water.

## 2018-06-17 NOTE — Anesthesia Preprocedure Evaluation (Addendum)
Anesthesia Evaluation  Patient identified by MRN, date of birth, ID band Patient awake    Reviewed: Allergy & Precautions, NPO status , Patient's Chart, lab work & pertinent test results  Airway Mallampati: II  TM Distance: >3 FB Neck ROM: Full    Dental  (+) Dental Advisory Given, Teeth Intact   Pulmonary neg pulmonary ROS,    Pulmonary exam normal breath sounds clear to auscultation       Cardiovascular Exercise Tolerance: Good negative cardio ROS Normal cardiovascular exam Rhythm:Regular Rate:Normal     Neuro/Psych negative neurological ROS  negative psych ROS   GI/Hepatic negative GI ROS, Neg liver ROS,   Endo/Other  negative endocrine ROS  Renal/GU negative Renal ROS     Musculoskeletal  (+) Arthritis ,   Abdominal   Peds  Hematology negative hematology ROS (+)   Anesthesia Other Findings   Reproductive/Obstetrics negative OB ROS                           Anesthesia Physical Anesthesia Plan  ASA: II  Anesthesia Plan: General   Post-op Pain Management:    Induction: Intravenous  PONV Risk Score and Plan: 4 or greater and Ondansetron, Dexamethasone, Scopolamine patch - Pre-op and Midazolam  Airway Management Planned: LMA  Additional Equipment:   Intra-op Plan:   Post-operative Plan: Extubation in OR  Informed Consent: I have reviewed the patients History and Physical, chart, labs and discussed the procedure including the risks, benefits and alternatives for the proposed anesthesia with the patient or authorized representative who has indicated his/her understanding and acceptance.   Dental advisory given  Plan Discussed with: CRNA  Anesthesia Plan Comments:         Anesthesia Quick Evaluation

## 2018-06-18 ENCOUNTER — Encounter (HOSPITAL_BASED_OUTPATIENT_CLINIC_OR_DEPARTMENT_OTHER): Payer: Self-pay | Admitting: *Deleted

## 2018-06-18 ENCOUNTER — Ambulatory Visit (HOSPITAL_BASED_OUTPATIENT_CLINIC_OR_DEPARTMENT_OTHER): Payer: 59 | Admitting: Anesthesiology

## 2018-06-18 ENCOUNTER — Encounter (HOSPITAL_BASED_OUTPATIENT_CLINIC_OR_DEPARTMENT_OTHER)
Admission: RE | Disposition: A | Discharge status: Home / Self Care | Payer: Self-pay | Source: Ambulatory Visit | Attending: Obstetrics & Gynecology

## 2018-06-18 ENCOUNTER — Ambulatory Visit (HOSPITAL_BASED_OUTPATIENT_CLINIC_OR_DEPARTMENT_OTHER)
Admission: RE | Admit: 2018-06-18 | Discharge: 2018-06-18 | Disposition: A | Discharge status: Home / Self Care | Payer: 59 | Source: Ambulatory Visit | Attending: Obstetrics & Gynecology | Admitting: Obstetrics & Gynecology

## 2018-06-18 DIAGNOSIS — Z9049 Acquired absence of other specified parts of digestive tract: Secondary | ICD-10-CM | POA: Insufficient documentation

## 2018-06-18 DIAGNOSIS — N979 Female infertility, unspecified: Secondary | ICD-10-CM | POA: Insufficient documentation

## 2018-06-18 DIAGNOSIS — M1990 Unspecified osteoarthritis, unspecified site: Secondary | ICD-10-CM | POA: Diagnosis not present

## 2018-06-18 DIAGNOSIS — N840 Polyp of corpus uteri: Secondary | ICD-10-CM | POA: Diagnosis not present

## 2018-06-18 DIAGNOSIS — N841 Polyp of cervix uteri: Secondary | ICD-10-CM | POA: Diagnosis not present

## 2018-06-18 HISTORY — DX: Unspecified osteoarthritis, unspecified site: M19.90

## 2018-06-18 HISTORY — DX: Polyp of cervix uteri: N84.1

## 2018-06-18 HISTORY — PX: DILATATION & CURETTAGE/HYSTEROSCOPY WITH MYOSURE: SHX6511

## 2018-06-18 HISTORY — DX: Carpal tunnel syndrome, bilateral upper limbs: G56.03

## 2018-06-18 HISTORY — DX: Female infertility, unspecified: N97.9

## 2018-06-18 LAB — CBC
HEMATOCRIT: 38.2 % (ref 36.0–46.0)
HEMOGLOBIN: 12.7 g/dL (ref 12.0–15.0)
MCH: 28.2 pg (ref 26.0–34.0)
MCHC: 33.2 g/dL (ref 30.0–36.0)
MCV: 84.7 fL (ref 78.0–100.0)
Platelets: 427 10*3/uL — ABNORMAL HIGH (ref 150–400)
RBC: 4.51 MIL/uL (ref 3.87–5.11)
RDW: 14.3 % (ref 11.5–15.5)
WBC: 6.3 10*3/uL (ref 4.0–10.5)

## 2018-06-18 LAB — ABO/RH: ABO/RH(D): A POS

## 2018-06-18 LAB — TYPE AND SCREEN
ABO/RH(D): A POS
ANTIBODY SCREEN: NEGATIVE

## 2018-06-18 LAB — POCT PREGNANCY, URINE: PREG TEST UR: NEGATIVE

## 2018-06-18 SURGERY — DILATATION & CURETTAGE/HYSTEROSCOPY WITH MYOSURE
Anesthesia: General | Site: Vagina

## 2018-06-18 MED ORDER — IBUPROFEN 600 MG PO TABS: 600.0000 mg | ORAL_TABLET | Freq: Four times a day (QID) | ORAL | 0 refills | Status: DC | PRN

## 2018-06-18 MED ORDER — DEXAMETHASONE SODIUM PHOSPHATE 10 MG/ML IJ SOLN
INTRAMUSCULAR | Status: AC
  Filled 2018-06-18: qty 1

## 2018-06-18 MED ORDER — FENTANYL CITRATE (PF) 100 MCG/2ML IJ SOLN
25.0000 ug | INTRAMUSCULAR | Status: DC | PRN
  Filled 2018-06-18: qty 1

## 2018-06-18 MED ORDER — MIDAZOLAM HCL 2 MG/2ML IJ SOLN
INTRAMUSCULAR | Status: AC
  Filled 2018-06-18: qty 2

## 2018-06-18 MED ORDER — SODIUM CHLORIDE 0.9 % IR SOLN
Status: DC | PRN
  Administered 2018-06-18: 3000 mL

## 2018-06-18 MED ORDER — KETOROLAC TROMETHAMINE 30 MG/ML IJ SOLN
30.0000 mg | Freq: Once | INTRAMUSCULAR | Status: DC | PRN
  Filled 2018-06-18: qty 1

## 2018-06-18 MED ORDER — SCOPOLAMINE 1 MG/3DAYS TD PT72
MEDICATED_PATCH | TRANSDERMAL | Status: AC
  Filled 2018-06-18: qty 1

## 2018-06-18 MED ORDER — KETOROLAC TROMETHAMINE 30 MG/ML IJ SOLN
INTRAMUSCULAR | Status: AC
  Filled 2018-06-18: qty 1

## 2018-06-18 MED ORDER — LIDOCAINE 2% (20 MG/ML) 5 ML SYRINGE
INTRAMUSCULAR | Status: AC
  Filled 2018-06-18: qty 5

## 2018-06-18 MED ORDER — PROPOFOL 10 MG/ML IV BOLUS
INTRAVENOUS | Status: DC | PRN
  Administered 2018-06-18: 200 mg via INTRAVENOUS

## 2018-06-18 MED ORDER — PROMETHAZINE HCL 25 MG/ML IJ SOLN
6.2500 mg | INTRAMUSCULAR | Status: DC | PRN
  Filled 2018-06-18: qty 1

## 2018-06-18 MED ORDER — FENTANYL CITRATE (PF) 100 MCG/2ML IJ SOLN
INTRAMUSCULAR | Status: AC
  Filled 2018-06-18: qty 2

## 2018-06-18 MED ORDER — LACTATED RINGERS IV SOLN
INTRAVENOUS | Status: DC
  Filled 2018-06-18: qty 1000

## 2018-06-18 MED ORDER — OXYCODONE HCL 5 MG PO TABS
5.0000 mg | ORAL_TABLET | Freq: Once | ORAL | Status: DC | PRN
  Filled 2018-06-18: qty 1

## 2018-06-18 MED ORDER — KETOROLAC TROMETHAMINE 30 MG/ML IJ SOLN
INTRAMUSCULAR | Status: DC | PRN
  Administered 2018-06-18: 30 mg via INTRAVENOUS

## 2018-06-18 MED ORDER — LIDOCAINE 2% (20 MG/ML) 5 ML SYRINGE
INTRAMUSCULAR | Status: DC | PRN
  Administered 2018-06-18: 60 mg via INTRAVENOUS

## 2018-06-18 MED ORDER — FENTANYL CITRATE (PF) 100 MCG/2ML IJ SOLN
INTRAMUSCULAR | Status: DC | PRN
  Administered 2018-06-18: 100 ug via INTRAVENOUS

## 2018-06-18 MED ORDER — PROPOFOL 10 MG/ML IV BOLUS
INTRAVENOUS | Status: AC
  Filled 2018-06-18: qty 40

## 2018-06-18 MED ORDER — ONDANSETRON HCL 4 MG/2ML IJ SOLN
INTRAMUSCULAR | Status: DC | PRN
  Administered 2018-06-18: 4 mg via INTRAVENOUS

## 2018-06-18 MED ORDER — ARTIFICIAL TEARS OPHTHALMIC OINT
TOPICAL_OINTMENT | OPHTHALMIC | Status: AC
  Filled 2018-06-18: qty 3.5

## 2018-06-18 MED ORDER — ONDANSETRON HCL 4 MG/2ML IJ SOLN
INTRAMUSCULAR | Status: AC
  Filled 2018-06-18: qty 2

## 2018-06-18 MED ORDER — DEXAMETHASONE SODIUM PHOSPHATE 10 MG/ML IJ SOLN
INTRAMUSCULAR | Status: DC | PRN
  Administered 2018-06-18: 10 mg via INTRAVENOUS

## 2018-06-18 MED ORDER — LACTATED RINGERS IV SOLN
INTRAVENOUS | Status: DC
  Administered 2018-06-18: 06:00:00 via INTRAVENOUS
  Filled 2018-06-18: qty 1000

## 2018-06-18 MED ORDER — MEPERIDINE HCL 25 MG/ML IJ SOLN
6.2500 mg | INTRAMUSCULAR | Status: DC | PRN
  Filled 2018-06-18: qty 1

## 2018-06-18 MED ORDER — OXYCODONE-ACETAMINOPHEN 5-325 MG PO TABS: 1.0000 | ORAL_TABLET | ORAL | 0 refills | Status: DC | PRN

## 2018-06-18 MED ORDER — OXYCODONE HCL 5 MG/5ML PO SOLN
5.0000 mg | Freq: Once | ORAL | Status: DC | PRN
  Filled 2018-06-18: qty 5

## 2018-06-18 SURGICAL SUPPLY — 31 items
BIPOLAR CUTTING LOOP 21FR (ELECTRODE)
CANISTER SUCT 3000ML PPV (MISCELLANEOUS) ×2 IMPLANT
CATH ROBINSON RED A/P 16FR (CATHETERS) ×2 IMPLANT
DEVICE MYOSURE LITE (MISCELLANEOUS) ×2 IMPLANT
DEVICE MYOSURE REACH (MISCELLANEOUS) IMPLANT
DILATOR CANAL MILEX (MISCELLANEOUS) IMPLANT
ELECT REM PT RETURN 9FT ADLT (ELECTROSURGICAL)
ELECTRODE REM PT RTRN 9FT ADLT (ELECTROSURGICAL) IMPLANT
GAUZE SPONGE 4X4 16PLY XRAY LF (GAUZE/BANDAGES/DRESSINGS) ×2 IMPLANT
GLOVE BIO SURGEON STRL SZ 6 (GLOVE) ×4 IMPLANT
GLOVE BIO SURGEON STRL SZ7 (GLOVE) ×2 IMPLANT
GLOVE BIOGEL PI IND STRL 6 (GLOVE) ×1 IMPLANT
GLOVE BIOGEL PI IND STRL 7.0 (GLOVE) ×1 IMPLANT
GLOVE BIOGEL PI IND STRL 7.5 (GLOVE) ×3 IMPLANT
GLOVE BIOGEL PI INDICATOR 6 (GLOVE) ×1
GLOVE BIOGEL PI INDICATOR 7.0 (GLOVE) ×1
GLOVE BIOGEL PI INDICATOR 7.5 (GLOVE) ×3
GLOVE SURG SS PI 6.0 STRL IVOR (GLOVE) ×4 IMPLANT
GOWN STRL REUS W/ TWL LRG LVL3 (GOWN DISPOSABLE) ×2 IMPLANT
GOWN STRL REUS W/TWL LRG LVL3 (GOWN DISPOSABLE) ×2
IV NS IRRIG 3000ML ARTHROMATIC (IV SOLUTION) ×2 IMPLANT
KIT PROCEDURE FLUENT (KITS) ×2 IMPLANT
KIT TURNOVER CYSTO (KITS) ×2 IMPLANT
LOOP CUTTING BIPOLAR 21FR (ELECTRODE) IMPLANT
MYOSURE XL FIBROID REM (MISCELLANEOUS)
PACK VAGINAL MINOR WOMEN LF (CUSTOM PROCEDURE TRAY) ×2 IMPLANT
PAD OB MATERNITY 4.3X12.25 (PERSONAL CARE ITEMS) ×2 IMPLANT
SEAL ROD LENS SCOPE MYOSURE (ABLATOR) ×2 IMPLANT
SYSTEM TISS REMOVAL MYSR XL RM (MISCELLANEOUS) IMPLANT
TOWEL OR 17X24 6PK STRL BLUE (TOWEL DISPOSABLE) ×4 IMPLANT
WATER STERILE IRR 500ML POUR (IV SOLUTION) ×2 IMPLANT

## 2018-06-18 NOTE — Op Note (Signed)
PREOPERATIVE DIAGNOSIS:  42 y.o. with endometrial polyp, infertility  POSTOPERATIVE DIAGNOSIS: The same  PROCEDURE: Hysteroscopy, Dilation and Curettage, Myosure  SURGEON:  Dr. Linda Hedges  INDICATIONS: 42 y.o. G0 here for scheduled surgery for removal of endometrial polyp.   Risks of surgery were discussed with the patient including but not limited to: bleeding which may require transfusion; infection which may require antibiotics; injury to uterus or surrounding organs; intrauterine scarring which may impair future fertility; need for additional procedures including laparotomy or laparoscopy; and other postoperative/anesthesia complications. Written informed consent was obtained.    FINDINGS:  A 6 week size uterus.  Diffuse proliferative endometrium and endometrial polyp on right lower uterine segment prolapsing through endocervix.  Normal ostia bilaterally.  ANESTHESIA:   General  ESTIMATED BLOOD LOSS:  Less than 20 ml  SPECIMENS: Myosure specimen sent to pathology  COMPLICATIONS:  None immediate.  PROCEDURE DETAILS:  The patient was taken to the operating room where general anesthesia was administered and was found to be adequate.  After an adequate timeout was performed, she was placed in the dorsal lithotomy position and examined; then prepped and draped in the sterile manner.   Her bladder was catheterized for an unmeasured amount of clear, yellow urine. A speculum was then placed in the patient's vagina and a single tooth tenaculum was applied to the anterior lip of the cervix. The uteus was sounded to 7 cm and dilated manually with metal dilators to accommodate the operative hysteroscope.  Once the cervix was dilated, the hysteroscope was inserted under direct visualization using saline as a suspension medium.  The uterine cavity was carefully examined with the above listed findings noted.   The Myosure Lite was opened and advanced into the uterine cavity.  The endometrial polyp was  easily removed.  Following Myosure, an entirely normal appearing uterine cavity was noted including bilateral ostia and cervical canal.  The hysteroscope was removed under direct visualization.  The tenaculum was removed from the anterior lip of the cervix and the vaginal speculum was removed after noting good hemostasis.  The patient tolerated the procedure well and was taken to the recovery area awake, extubated and in stable condition.

## 2018-06-18 NOTE — Anesthesia Postprocedure Evaluation (Signed)
Anesthesia Post Note  Patient: Donna Fowler  Procedure(s) Performed: DILATATION & CURETTAGE/HYSTEROSCOPY WITH MYOSURE (N/A Vagina )     Patient location during evaluation: PACU Anesthesia Type: General Level of consciousness: sedated and patient cooperative Pain management: pain level controlled Vital Signs Assessment: post-procedure vital signs reviewed and stable Respiratory status: spontaneous breathing Cardiovascular status: stable Anesthetic complications: no    Last Vitals:  Vitals:   06/18/18 0830 06/18/18 0845  BP: 119/78 118/83  Pulse: 71 71  Resp: (!) 21 19  Temp:    SpO2: 98% 97%    Last Pain:  Vitals:   06/18/18 0855  TempSrc:   PainSc: 0-No pain                 Nolon Nations

## 2018-06-18 NOTE — Anesthesia Procedure Notes (Signed)
Procedure Name: LMA Insertion Date/Time: 06/18/2018 7:29 AM Performed by: Wanita Chamberlain, CRNA Pre-anesthesia Checklist: Patient identified, Timeout performed, Emergency Drugs available, Suction available and Patient being monitored Patient Re-evaluated:Patient Re-evaluated prior to induction Oxygen Delivery Method: Circle system utilized Preoxygenation: Pre-oxygenation with 100% oxygen Induction Type: IV induction Ventilation: Mask ventilation without difficulty LMA: LMA inserted LMA Size: 4.0 Number of attempts: 1 Placement Confirmation: CO2 detector,  positive ETCO2 and breath sounds checked- equal and bilateral Tube secured with: Tape Dental Injury: Teeth and Oropharynx as per pre-operative assessment

## 2018-06-18 NOTE — Transfer of Care (Signed)
Immediate Anesthesia Transfer of Care Note  Patient: Donna Fowler  Procedure(s) Performed: DILATATION & CURETTAGE/HYSTEROSCOPY WITH MYOSURE (N/A Vagina )  Patient Location: PACU  Anesthesia Type:General  Level of Consciousness: awake, alert , oriented and patient cooperative  Airway & Oxygen Therapy: Patient Spontanous Breathing and Patient connected to nasal cannula oxygen  Post-op Assessment: Report given to RN and Post -op Vital signs reviewed and stable  Post vital signs: Reviewed and stable  Last Vitals:  Vitals Value Taken Time  BP 120/71 06/18/2018  8:02 AM  Temp    Pulse 77 06/18/2018  8:04 AM  Resp 14 06/18/2018  8:04 AM  SpO2 99 % 06/18/2018  8:04 AM  Vitals shown include unvalidated device data.  Last Pain:  Vitals:   06/18/18 0536  TempSrc: Oral         Complications: No apparent anesthesia complications

## 2018-06-18 NOTE — Progress Notes (Signed)
No change to H&P.  Elgin Carn, DO 

## 2018-06-18 NOTE — Discharge Instructions (Signed)
Call MD for T>100.4, heavy vaginal bleeding, severe abdominal pain, intractable nausea and/or vomiting, or respiratory distress.  Call office to schedule postop appointment in 2 weeks.  No driving while taking narcotics.  Pelvic rest x 4 weeks.     Post Anesthesia Home Care Instructions  Activity: Get plenty of rest for the remainder of the day. A responsible individual must stay with you for 24 hours following the procedure.  For the next 24 hours, DO NOT: -Drive a car -Paediatric nurse -Drink alcoholic beverages -Take any medication unless instructed by your physician -Make any legal decisions or sign important papers.  Meals: Start with liquid foods such as gelatin or soup. Progress to regular foods as tolerated. Avoid greasy, spicy, heavy foods. If nausea and/or vomiting occur, drink only clear liquids until the nausea and/or vomiting subsides. Call your physician if vomiting continues.  Special Instructions/Symptoms: Your throat may feel dry or sore from the anesthesia or the breathing tube placed in your throat during surgery. If this causes discomfort, gargle with warm salt water. The discomfort should disappear within 24 hours.  If you had a scopolamine patch placed behind your ear for the management of post- operative nausea and/or vomiting:  1. The medication in the patch is effective for 72 hours, after which it should be removed.  Wrap patch in a tissue and discard in the trash. Wash hands thoroughly with soap and water. 2. You may remove the patch earlier than 72 hours if you experience unpleasant side effects which may include dry mouth, dizziness or visual disturbances. 3. Avoid touching the patch. Wash your hands with soap and water after contact with the patch.     [facility] Home care Instructions:   Personal hygiene:  Used sanitary napkins for vaginal drainage not tampons. Shower or tub bathe the day after your procedure. No douching until bleeding stops. Always wipe  from front to back after  Elimination.  Activity: Do not drive or operate any equipment today. The effects of the anesthesia are still present and drowsiness may result. Rest today, not necessarily flat bed rest, just take it easy. You may resume your normal activity in one to 2 days.  Sexual activity: No intercourse for one week or as indicated by your physician  Diet: Eat a light diet as desired this evening. You may resume a regular diet tomorrow.  Return to work: One to 2 days.  General Expectations of your surgery: Vaginal bleeding should be no heavier than a normal period. Spotting may continue up to 10 days. Mild cramps may continue for a couple of days. You may have a regular period in 2-6 weeks.  Unexpected observations call your doctor if these occur: persistent or heavy bleeding. Severe abdominal cramping or pain. Elevation of temperature greater than 100F.  Call for an appointment in one week.      _

## 2018-06-21 ENCOUNTER — Encounter (HOSPITAL_BASED_OUTPATIENT_CLINIC_OR_DEPARTMENT_OTHER): Payer: Self-pay | Admitting: Obstetrics & Gynecology

## 2018-08-03 ENCOUNTER — Other Ambulatory Visit: Payer: Self-pay | Admitting: Internal Medicine

## 2018-08-03 DIAGNOSIS — Z1231 Encounter for screening mammogram for malignant neoplasm of breast: Secondary | ICD-10-CM

## 2018-08-23 DIAGNOSIS — N979 Female infertility, unspecified: Secondary | ICD-10-CM | POA: Diagnosis not present

## 2018-08-23 DIAGNOSIS — E7800 Pure hypercholesterolemia, unspecified: Secondary | ICD-10-CM | POA: Diagnosis not present

## 2018-08-23 DIAGNOSIS — Z6834 Body mass index (BMI) 34.0-34.9, adult: Secondary | ICD-10-CM | POA: Diagnosis not present

## 2018-08-23 DIAGNOSIS — R7309 Other abnormal glucose: Secondary | ICD-10-CM | POA: Diagnosis not present

## 2018-08-23 DIAGNOSIS — K219 Gastro-esophageal reflux disease without esophagitis: Secondary | ICD-10-CM | POA: Diagnosis not present

## 2018-09-02 ENCOUNTER — Ambulatory Visit
Admission: RE | Admit: 2018-09-02 | Discharge: 2018-09-02 | Disposition: A | Discharge status: Home / Self Care | Payer: 59 | Source: Ambulatory Visit | Attending: Internal Medicine | Admitting: Internal Medicine

## 2018-09-02 DIAGNOSIS — Z1231 Encounter for screening mammogram for malignant neoplasm of breast: Secondary | ICD-10-CM | POA: Insufficient documentation

## 2018-09-06 DIAGNOSIS — H524 Presbyopia: Secondary | ICD-10-CM | POA: Diagnosis not present

## 2018-09-06 DIAGNOSIS — H5213 Myopia, bilateral: Secondary | ICD-10-CM | POA: Diagnosis not present

## 2018-11-08 ENCOUNTER — Encounter: Payer: Self-pay | Admitting: Physician Assistant

## 2018-11-08 ENCOUNTER — Ambulatory Visit (INDEPENDENT_AMBULATORY_CARE_PROVIDER_SITE_OTHER): Payer: Self-pay | Admitting: Physician Assistant

## 2018-11-08 VITALS — BP 130/92 | HR 102 | Temp 99.3°F | Resp 16 | Ht 63.0 in | Wt 187.0 lb

## 2018-11-08 DIAGNOSIS — J111 Influenza due to unidentified influenza virus with other respiratory manifestations: Secondary | ICD-10-CM

## 2018-11-08 DIAGNOSIS — R69 Illness, unspecified: Secondary | ICD-10-CM

## 2018-11-08 MED ORDER — ALBUTEROL SULFATE HFA 108 (90 BASE) MCG/ACT IN AERS: 2.0000 | INHALATION_SPRAY | Freq: Four times a day (QID) | RESPIRATORY_TRACT | 0 refills | Status: DC | PRN

## 2018-11-08 MED ORDER — PSEUDOEPH-BROMPHEN-DM 30-2-10 MG/5ML PO SYRP: 5.0000 mL | ORAL_SOLUTION | Freq: Four times a day (QID) | ORAL | 0 refills | Status: DC | PRN

## 2018-11-08 MED ORDER — BENZONATATE 100 MG PO CAPS: 100.0000 mg | ORAL_CAPSULE | Freq: Three times a day (TID) | ORAL | 0 refills | Status: DC | PRN

## 2018-11-08 NOTE — Progress Notes (Signed)
MRN: [id number] DOB: 1976/02/03  Subjective:   Donna Fowler is a 43 y.o. female presenting for chief complaint of Cough (x3d) and Fatigue (x3d) .  Reports 3 day history of chills, body aches, dry cough, nasal congestion, and left ear fullness. Sudden onset after visiting a friend with similar symptoms. Had some left over tamiflu so started it the day her sx appeared. She has been feeling better. The cough is just persistent and aggravating her. Has tried OTC cold and flu cough syrup with no full relief. Did feel wheezing a couple days ago, has left over albuterol inhaler that she used and felt immediately better. Is wondering if she can get a refill for albuterol inhaler as hers is old and almost out. Does not have PMH of asthma or COPD but has had illness induced bronchospasm in the past. Denies fever, sinus pain, sore throat, inability to swallow, productive cough, shortness of breath, difficulty breathing, chest pain, nausea, vomiting, abdominal pain and diarrhea. Hx of seasonal allergies, takes flonase as needed. 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  No PMH of DM, HTN, or heart disease. Pt had flu shot this season. Denies smoking. Of note, pt did travel to [place] in 09/2018. Arrived back to [place] 10/20/2018. Direct flight from [place] to [place], no layover in [place]. No exposure to close contacts with confirmed coronavirus that she is aware of.  Denies any other aggravating or relieving factors, no other questions or concerns.  Review of Systems  Constitutional: Negative for diaphoresis.  HENT: Positive for congestion. Negative for ear discharge.   Respiratory: Negative for hemoptysis and sputum production.   Cardiovascular: Negative for palpitations.  Musculoskeletal: Negative for neck pain.  Skin: Negative for rash.  Neurological: Negative for dizziness and headaches.    Donna Fowler  has a current medication list which includes the following prescription(s): acetaminophen, atorvastatin, buspirone, cetirizine, vitamin d3, fluticasone, multiple vitamins-minerals, oseltamivir, pantoprazole, sertraline, vitamin e, albuterol, benzonatate, and brompheniramine-pseudoephedrine-dm. Also has No Known Allergies.  Donna Fowler  has a past medical history of Arthritis, Carpal tunnel syndrome on both sides, Endocervical polyp, and Infertility, female. Also  has a past surgical history that includes Laparoscopic cholecystectomy (2009); Elbow arthroscopy (Left, 2014); Colonoscopy with esophagogastroduodenoscopy (egd) (2017); and Dilatation & curettage/hysteroscopy with myosure (N/A, 06/18/2018).   Objective:   Vitals: BP (!) 130/92 (BP Location: Right Arm, Patient Position: Sitting, Cuff Size: Normal)   Pulse (!) 102   Temp 99.3 F (37.4 C)   Resp 16   Ht 5\' 3"  (1.6 m)   Wt 187 lb (84.8 kg)   LMP 10/22/2018   SpO2 98%   BMI 33.13 kg/m    Physical Exam Vitals signs reviewed.  Constitutional:      General: She is not in acute distress.    Appearance: She is well-developed. She is not ill-appearing or toxic-appearing.  HENT:     Head: Normocephalic and atraumatic.     Right Ear: Ear canal and external ear normal. No drainage or tenderness. No middle ear effusion. No mastoid tenderness. Tympanic membrane is not erythematous or bulging.     Left Ear: Ear canal and external ear normal. No drainage or tenderness.  No middle ear effusion. No mastoid tenderness. Tympanic membrane is perforated. Tympanic membrane is not erythematous or bulging.     Ears:      Nose: Congestion and rhinorrhea present. Rhinorrhea  is clear.     Right Sinus: No maxillary sinus tenderness or frontal sinus tenderness.     Left Sinus: No maxillary sinus tenderness or frontal sinus tenderness.     Mouth/Throat:     Lips: Pink.     Mouth: Mucous membranes are moist.     Pharynx: Uvula midline. Posterior oropharyngeal  erythema present.     Tonsils: No tonsillar exudate or tonsillar abscesses.  Eyes:     Conjunctiva/sclera: Conjunctivae normal.  Neck:     Musculoskeletal: Normal range of motion.  Cardiovascular:     Rate and Rhythm: Regular rhythm. Tachycardia present.     Heart sounds: Normal heart sounds.  Pulmonary:     Effort: Pulmonary effort is normal. No accessory muscle usage or respiratory distress.     Breath sounds: Normal breath sounds. No decreased breath sounds, wheezing, rhonchi or rales.     Comments: Frequent dry coughing noted throughout exam.  Lymphadenopathy:     Head:     Right side of head: No submental, submandibular, tonsillar, preauricular, posterior auricular or occipital adenopathy.     Left side of head: No submental, submandibular, tonsillar, preauricular, posterior auricular or occipital adenopathy.     Cervical: No cervical adenopathy.     Upper Body:     Right upper body: No supraclavicular adenopathy.     Left upper body: No supraclavicular adenopathy.  Skin:    General: Skin is warm and dry.  Neurological:     Mental Status: She is alert.     No results found for this or any previous visit (from the past 24 hour(s)).  Assessment and Plan :  1. Influenza-like illness Patient is overall well-appearing, no acute distress. HR mildly elevated 102 bpm-likely due to frequent coughing and underlying illness. Temp 99.3. Suspect influenza like illness. She has been on tamiflu for past 3 days and has felt overall improvement, dry cough is the most bothersome symptom at this time.  Lungs CTAB.  SpO2 98%. No SOB, difficulty breathing, or chest pain noted.   Pt did travel to [place] in 09/2018 and returned 20 days ago. She has been asymptomatic since travel until 3 days ago after being exposed to a friend with similar sx.  She had no close contact w/ lab confirmed 2019-nCoV patient.  No layover or travel to [place]. No concern for Coronavirus at this time. Given educational material  on influenza like illness. Pt does work in the [employer], so recommend completing tamiflu Rx at this time along with symptomatic treatment. Pt does have TM perforation, which is not new. No findings on exam to suggest underlying AOM at this time. No TM erythema or bulging.  Perforation site is not actively draining. Rec contacting ENT and scheduling a follow up.   Advised to follow up with family doctor or local urgent care if no improvement in symptoms after 5-7 days.  Seek care sooner at local urgent care or ED if symptoms worsen/develop new concerning symptoms.  Patient voices understanding. - brompheniramine-pseudoephedrine-DM 30-2-10 MG/5ML syrup; Take 5 mLs by mouth 4 (four) times daily as needed.  Dispense: 120 mL; Refill: 0 - benzonatate (TESSALON) 100 MG capsule; Take 1-2 capsules (100-200 mg total) by mouth 3 (three) times daily as needed for cough.  Dispense: 40 capsule; Refill: 0 - albuterol (PROVENTIL HFA;VENTOLIN HFA) 108 (90 Base) MCG/ACT inhaler; Inhale 2 puffs into the lungs every 6 (six) hours as needed for wheezing or shortness of breath.  Dispense: 1 Inhaler; Refill: 0  Tenna Delaine, PA-C  [facility] 11/08/2018 9:46 AM

## 2018-11-08 NOTE — Patient Instructions (Signed)
Influenza, Adult  Continue tamiflu until complete. Use bromfed and tessalon perles for cough. Use albuterol inhaler every 4-6 hours as needed for cough.  Continue flonase. Schedule follow up with ENT.  If no improvement in 5-7 days please follow up with family doctor or local urgent care. If any symptoms worsen or you develop new concerning symptoms, please seek care immediatly at ED.   Influenza is also called "the flu." It is an infection in the lungs, nose, and throat (respiratory tract). It is caused by a virus. The flu causes symptoms that are similar to symptoms of a cold. It also causes a high fever and body aches. The flu spreads easily from person to person (is contagious). Getting a flu shot (influenza vaccination) every year is the best way to prevent the flu. What are the causes? This condition is caused by the influenza virus. You can get the virus by:  Breathing in droplets that are in the air from the cough or sneeze of a person who has the virus.  Touching something that has the virus on it (is contaminated) and then touching your mouth, nose, or eyes. What increases the risk? Certain things may make you more likely to get the flu. These include:  Not washing your hands often.  Having close contact with many people during cold and flu season.  Touching your mouth, eyes, or nose without first washing your hands.  Not getting a flu shot every year. You may have a higher risk for the flu, along with serious problems such as a lung infection (pneumonia), if you:  Are older than 65.  Are pregnant.  Have a weakened disease-fighting system (immune system) because of a disease or taking certain medicines.  Have a long-term (chronic) illness, such as: ? Heart, kidney, or lung disease. ? Diabetes. ? Asthma.  Have a liver disorder.  Are very overweight (morbidly obese).  Have anemia. This is a condition that affects your red blood cells. What are the signs or  symptoms? Symptoms usually begin suddenly and last 4-14 days. They may include:  Fever and chills.  Headaches, body aches, or muscle aches.  Sore throat.  Cough.  Runny or stuffy (congested) nose.  Chest discomfort.  Not wanting to eat as much as normal (poor appetite).  Weakness or feeling tired (fatigue).  Dizziness.  Feeling sick to your stomach (nauseous) or throwing up (vomiting). How is this treated? If the flu is found early, you can be treated with medicine that can help reduce how bad the illness is and how long it lasts (antiviral medicine). This may be given by mouth (orally) or through an IV tube. Taking care of yourself at home can help your symptoms get better. Your doctor may suggest:  Taking over-the-counter medicines.  Drinking plenty of fluids. The flu often goes away on its own. If you have very bad symptoms or other problems, you may be treated in a hospital. Follow these instructions at home:     Activity  Rest as needed. Get plenty of sleep.  Stay home from work or school as told by your doctor. ? Do not leave home until you do not have a fever for 24 hours without taking medicine. ? Leave home only to visit your doctor. Eating and drinking  Take an ORS (oral rehydration solution). This is a drink that is sold at pharmacies and stores.  Drink enough fluid to keep your pee (urine) pale yellow.  Drink clear fluids in small amounts as  you are able. Clear fluids include: ? Water. ? Ice chips. ? Fruit juice that has water added (diluted fruit juice). ? Low-calorie sports drinks.  Eat bland, easy-to-digest foods in small amounts as you are able. These foods include: ? Bananas. ? Applesauce. ? Rice. ? Lean meats. ? Toast. ? Crackers.  Do not eat or drink: ? Fluids that have a lot of sugar or caffeine. ? Alcohol. ? Spicy or fatty foods. General instructions  Take over-the-counter and prescription medicines only as told by your  doctor.  Use a cool mist humidifier to add moisture to the air in your home. This can make it easier for you to breathe.  Cover your mouth and nose when you cough or sneeze.  Wash your hands with soap and water often, especially after you cough or sneeze. If you cannot use soap and water, use alcohol-based hand sanitizer.  Keep all follow-up visits as told by your doctor. This is important. How is this prevented?   Get a flu shot every year. You may get the flu shot in late summer, fall, or winter. Ask your doctor when you should get your flu shot.  Avoid contact with people who are sick during fall and winter (cold and flu season). Contact a doctor if:  You get new symptoms.  You have: ? Chest pain. ? Watery poop (diarrhea). ? A fever.  Your cough gets worse.  You start to have more mucus.  You feel sick to your stomach.  You throw up. Get help right away if you:  Have shortness of breath.  Have trouble breathing.  Have skin or nails that turn a bluish color.  Have very bad pain or stiffness in your neck.  Get a sudden headache.  Get sudden pain in your face or ear.  Cannot eat or drink without throwing up. Summary  Influenza ("the flu") is an infection in the lungs, nose, and throat. It is caused by a virus.  Take over-the-counter and prescription medicines only as told by your doctor.  Getting a flu shot every year is the best way to avoid getting the flu. This information is not intended to replace advice given to you by your health care provider. Make sure you discuss any questions you have with your health care provider. Document Released: 06/17/2008 Document Revised: 02/24/2018 Document Reviewed: 02/24/2018 Elsevier Interactive Patient Education  2019 [employer].

## 2018-11-09 ENCOUNTER — Telehealth: Payer: Self-pay | Admitting: Emergency Medicine

## 2018-11-09 NOTE — Telephone Encounter (Signed)
Patient stated that coughing has kept her up just wants to sleep so she can get back to work in [place] that she had picked up Robitussin yesterday and has been taking  with cough syrup.

## 2018-11-09 NOTE — Telephone Encounter (Signed)
Recommend increase fluids; water, hot tea with lemon/honey, or diluted juice (1/2 water and 1/2 juice). May use over the counter Mucinex and Robitussin for cough. May continue to use albuterol inhaler for cough.  Hope that helps, SFS PA-C

## 2018-11-09 NOTE — Telephone Encounter (Signed)
Patient contacted office stating that the cough syrup and Tessalon perls is not helping. Please advise.

## 2018-11-10 NOTE — Telephone Encounter (Signed)
Tricks for settling the cough at night can be using a cool mist humidifier in the bedroom and sleeping in a propped up position either with several pillows or in a recliner.  [facility] does not prescribe controlled substances/narcotics; including narcotic cough syrup such as Hycodan cough syrup or Tussionex.  Thanks, SFS PA-C

## 2018-12-28 ENCOUNTER — Encounter: Payer: Self-pay | Admitting: *Deleted

## 2019-03-02 ENCOUNTER — Ambulatory Visit: Payer: Self-pay | Admitting: Gastroenterology

## 2019-08-17 ENCOUNTER — Other Ambulatory Visit: Payer: Self-pay | Admitting: Physician Assistant

## 2019-08-17 DIAGNOSIS — J111 Influenza due to unidentified influenza virus with other respiratory manifestations: Secondary | ICD-10-CM

## 2019-08-18 NOTE — Telephone Encounter (Signed)
Forwarding medication refill request to provider for review. Unable to route to PCP - name is not listed.

## 2019-09-26 IMAGING — MG DIGITAL SCREENING BILATERAL MAMMOGRAM WITH TOMO AND CAD
8 series · 8 of 24 positions shown · non-contrast
Comparison: Previous exam(s).

CLINICAL DATA: Screening.

EXAM:
DIGITAL SCREENING BILATERAL MAMMOGRAM WITH TOMO AND CAD

[L MLO synth-2D]
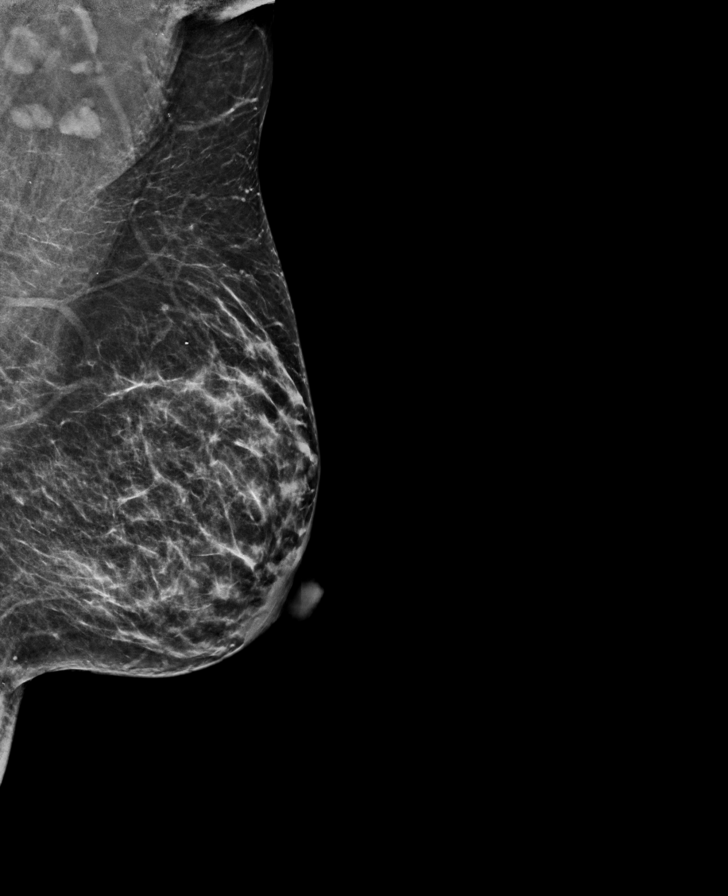

[R MLO synth-2D]
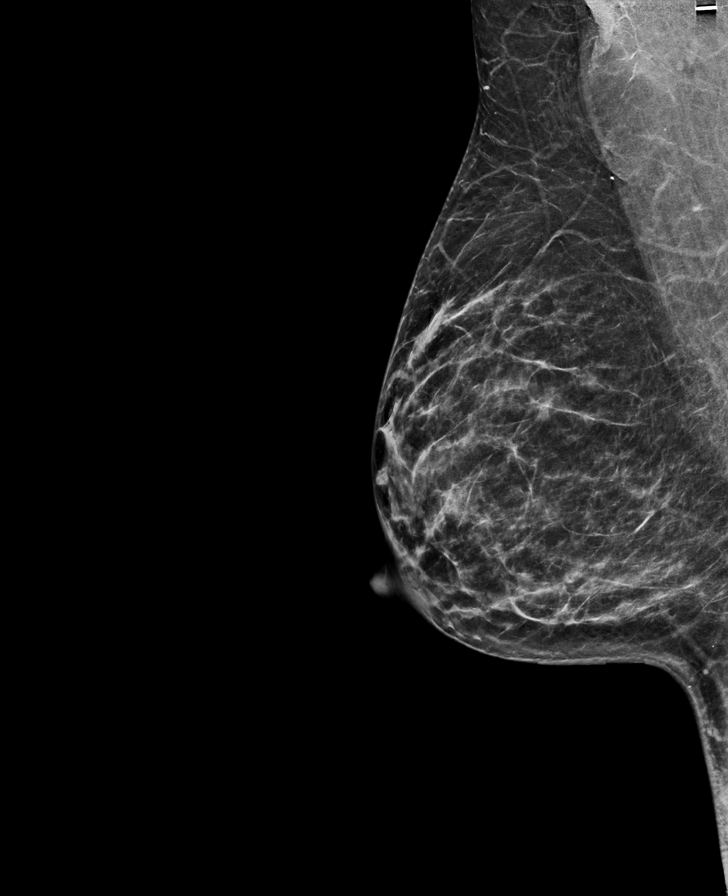

[R CC synth-2D]
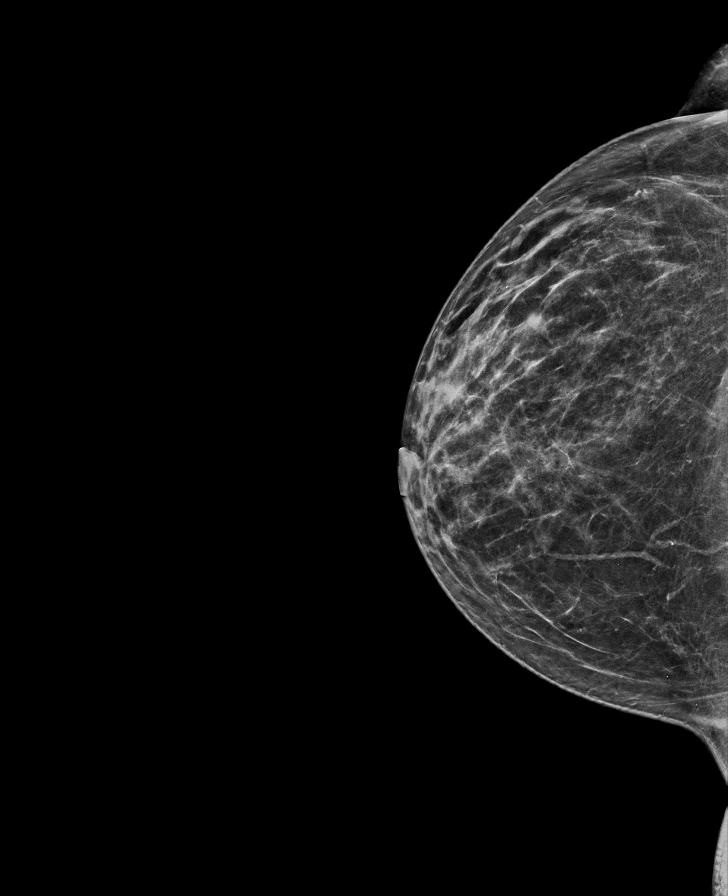

[L CC synth-2D]
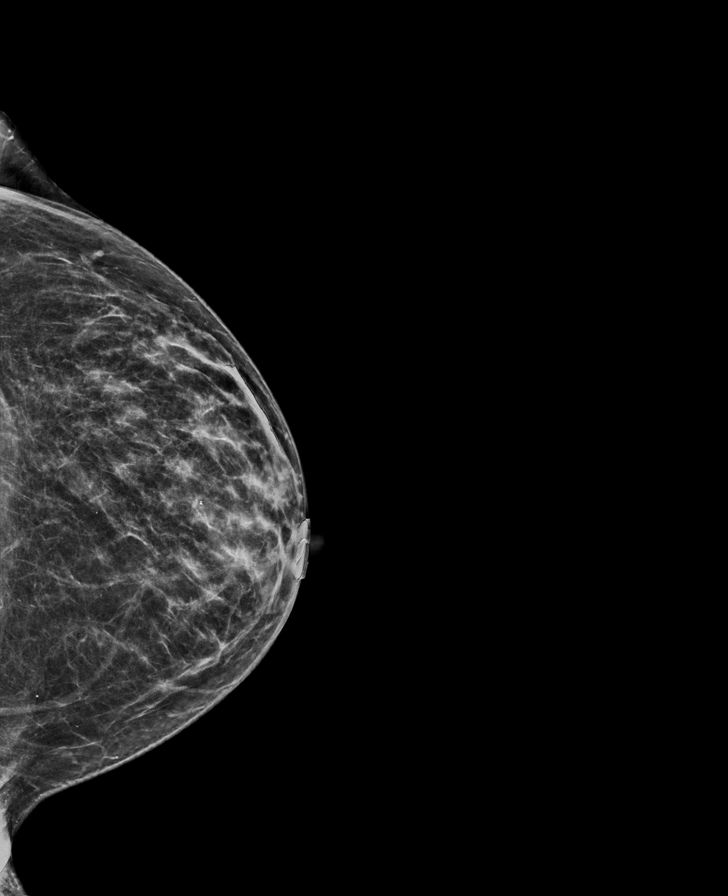

[L CC tomo · tomo slice 30/59.0]
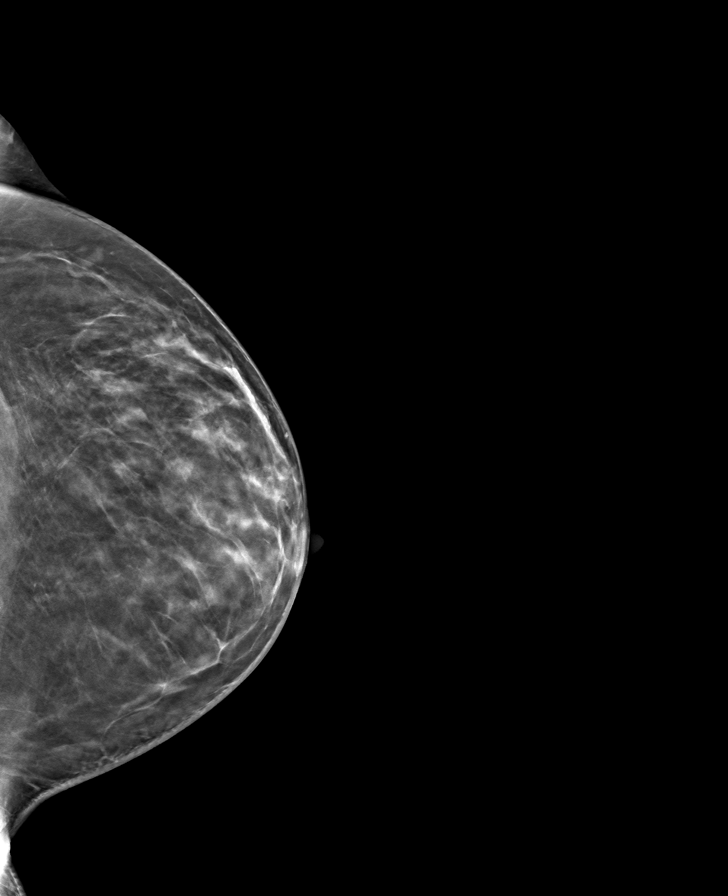

[L MLO tomo · tomo slice 31/62.0]
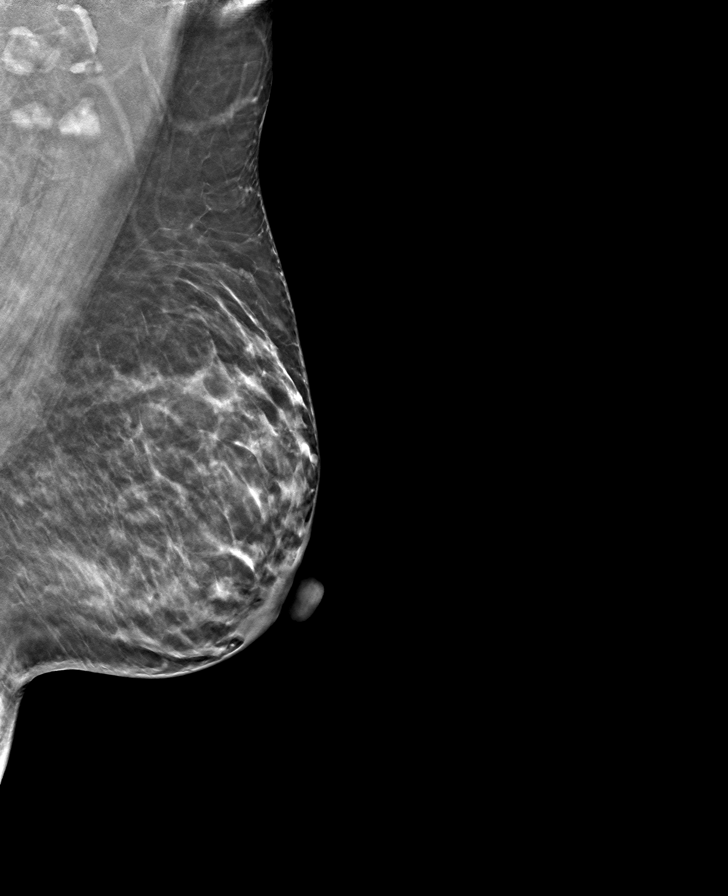

[R MLO tomo · tomo slice 31/60.0]
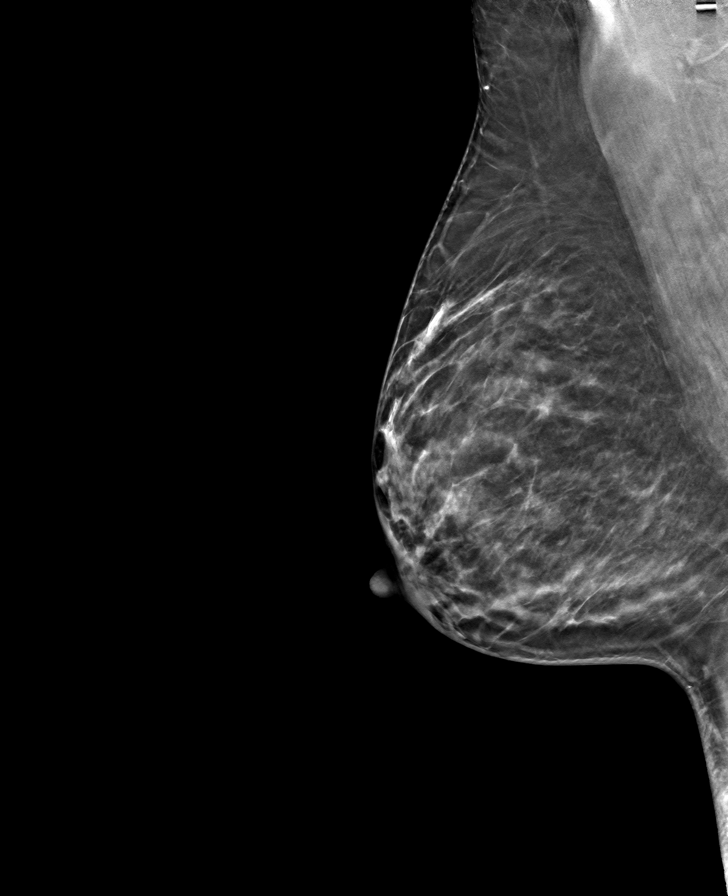

[R CC tomo · tomo slice 31/62.0]
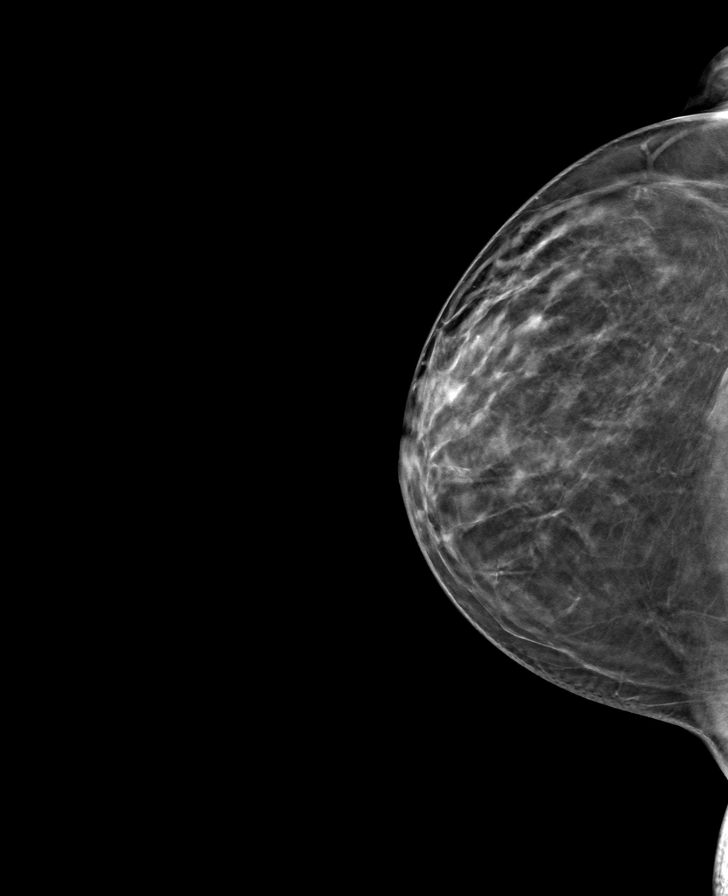

[8 of 24 positions shown; findings below may reference images not displayed]

ACR Breast Density Category c: The breast tissue is heterogeneously
dense, which may obscure small masses.
FINDINGS: There are no findings suspicious for malignancy. Images were
processed with CAD.
IMPRESSION: No mammographic evidence of malignancy. A result letter of this
screening mammogram will be mailed directly to the patient.

RECOMMENDATION:
Screening mammogram in one year. (Code:[id number])

BI-RADS CATEGORY  1: Negative.

## 2019-10-11 LAB — OB RESULTS CONSOLE GC/CHLAMYDIA
Chlamydia: NEGATIVE
Gonorrhea: NEGATIVE

## 2019-10-11 LAB — OB RESULTS CONSOLE RUBELLA ANTIBODY, IGM: Rubella: IMMUNE

## 2019-10-11 LAB — OB RESULTS CONSOLE ABO/RH: RH Type: POSITIVE

## 2019-10-11 LAB — OB RESULTS CONSOLE ANTIBODY SCREEN: Antibody Screen: NEGATIVE

## 2019-10-11 LAB — OB RESULTS CONSOLE RPR: RPR: NONREACTIVE

## 2019-10-11 LAB — OB RESULTS CONSOLE HIV ANTIBODY (ROUTINE TESTING): HIV: NONREACTIVE

## 2019-10-11 LAB — OB RESULTS CONSOLE HEPATITIS B SURFACE ANTIGEN: Hepatitis B Surface Ag: NEGATIVE

## 2019-11-07 ENCOUNTER — Ambulatory Visit: Payer: No Typology Code available for payment source | Admitting: Cardiology

## 2019-11-07 ENCOUNTER — Other Ambulatory Visit: Payer: Self-pay

## 2019-11-11 ENCOUNTER — Other Ambulatory Visit: Payer: Self-pay

## 2019-11-11 ENCOUNTER — Encounter: Payer: Self-pay | Admitting: Cardiology

## 2019-11-11 ENCOUNTER — Ambulatory Visit (INDEPENDENT_AMBULATORY_CARE_PROVIDER_SITE_OTHER): Payer: No Typology Code available for payment source

## 2019-11-11 ENCOUNTER — Ambulatory Visit (INDEPENDENT_AMBULATORY_CARE_PROVIDER_SITE_OTHER): Payer: No Typology Code available for payment source | Admitting: Cardiology

## 2019-11-11 VITALS — BP 122/78 | HR 85 | Ht 63.0 in | Wt 198.8 lb

## 2019-11-11 DIAGNOSIS — R0600 Dyspnea, unspecified: Secondary | ICD-10-CM

## 2019-11-11 DIAGNOSIS — R002 Palpitations: Secondary | ICD-10-CM

## 2019-11-11 DIAGNOSIS — E7800 Pure hypercholesterolemia, unspecified: Secondary | ICD-10-CM | POA: Diagnosis not present

## 2019-11-11 NOTE — Patient Instructions (Signed)
Medication Instructions:  - Your physician recommends that you continue on your current medications as directed. Please refer to the Current Medication list given to you today.  *If you need a refill on your cardiac medications before your next appointment, please call your pharmacy*  Lab Work: - none ordered  If you have labs (blood work) drawn today and your tests are completely normal, you will receive your results only by: Marland Kitchen MyChart Message (if you have MyChart) OR . A paper copy in the mail If you have any lab test that is abnormal or we need to change your treatment, we will call you to review the results.  Testing/Procedures: - Your physician has requested that you have an echocardiogram (after 2 weeks- need to complete the Zio monitor). Echocardiography is a painless test that uses sound waves to create images of your heart. It provides your doctor with information about the size and shape of your heart and how well your heart's chambers and valves are working. This procedure takes approximately one hour. There are no restrictions for this procedure.  - Your physician has recommended that you wear a 14 day Zio monitor (placed in office today). This monitor is a medical device that records the heart's electrical activity. Doctors most often use these monitors to diagnose arrhythmias. Arrhythmias are problems with the speed or rhythm of the heartbeat. The monitor is a small device applied to your chest. You can wear one while you do your normal daily activities. While wearing this monitor if you have any symptoms to push the button and record what you felt. Once you have worn this monitor for the period of time provider prescribed (Usually 14 days), you will return the monitor device in the postage paid box. Once it is returned they will download the data collected and provide [place] with a report which the provider will then review and we will call you with those results. Important  tips:  1. Avoid showering during the first 24 hours of wearing the monitor. 2. Avoid excessive sweating to help maximize wear time. 3. Do not submerge the device, no hot tubs, and no swimming pools. 4. Keep any lotions or oils away from the patch. 5. After 24 hours you may shower with the patch on. Take brief showers with your back facing the shower head.  6. Do not remove patch once it has been placed because that will interrupt data and decrease adhesive wear time. 7. Push the button when you have any symptoms and write down what you were feeling. 8. Once you have completed wearing your monitor, remove and place into box which has postage paid and place in your outgoing mailbox.  9. If for some reason you have misplaced your box then call our office and we can provide another box and/or mail it off for you.        Follow-Up: At [facility], you and your health needs are our priority.  As part of our continuing mission to provide you with exceptional heart care, we have created designated Provider Care Teams.  These Care Teams include your primary Cardiologist (physician) and Advanced Practice Providers (APPs -  Physician Assistants and Nurse Practitioners) who all work together to provide you with the care you need, when you need it.  Your next appointment:   4-5 weeks/ after cardiac testing is completed   The format for your next appointment:   In Person  Provider:   Kate Sable, MD  Other Instructions  n/a   Echocardiogram An echocardiogram is a procedure that uses painless sound waves (ultrasound) to produce an image of the heart. Images from an echocardiogram can provide important information about:  Signs of coronary artery disease (CAD).  Aneurysm detection. An aneurysm is a weak or damaged part of an artery wall that bulges out from the normal force of blood pumping through the body.  Heart size and shape. Changes in the size or shape of the heart can be  associated with certain conditions, including heart failure, aneurysm, and CAD.  Heart muscle function.  Heart valve function.  Signs of a past heart attack.  Fluid buildup around the heart.  Thickening of the heart muscle.  A tumor or infectious growth around the heart valves. Tell a health care provider about:  Any allergies you have.  All medicines you are taking, including vitamins, herbs, eye drops, creams, and over-the-counter medicines.  Any blood disorders you have.  Any surgeries you have had.  Any medical conditions you have.  Whether you are pregnant or may be pregnant. What are the risks? Generally, this is a safe procedure. However, problems may occur, including:  Allergic reaction to dye (contrast) that may be used during the procedure. What happens before the procedure? No specific preparation is needed. You may eat and drink normally. What happens during the procedure?   An IV tube may be inserted into one of your veins.  You may receive contrast through this tube. A contrast is an injection that improves the quality of the pictures from your heart.  A gel will be applied to your chest.  A wand-like tool (transducer) will be moved over your chest. The gel will help to transmit the sound waves from the transducer.  The sound waves will harmlessly bounce off of your heart to allow the heart images to be captured in real-time motion. The images will be recorded on a computer. The procedure may vary among health care providers and hospitals. What happens after the procedure?  You may return to your normal, everyday life, including diet, activities, and medicines, unless your health care provider tells you not to do that. Summary  An echocardiogram is a procedure that uses painless sound waves (ultrasound) to produce an image of the heart.  Images from an echocardiogram can provide important information about the size and shape of your heart, heart  muscle function, heart valve function, and fluid buildup around your heart.  You do not need to do anything to prepare before this procedure. You may eat and drink normally.  After the echocardiogram is completed, you may return to your normal, everyday life, unless your health care provider tells you not to do that. This information is not intended to replace advice given to you by your health care provider. Make sure you discuss any questions you have with your health care provider. Document Revised: 12/30/2018 Document Reviewed: 10/11/2016 Elsevier Patient Education  [place].

## 2019-11-11 NOTE — Progress Notes (Signed)
Cardiology Office Note:    Date:  11/11/2019   ID:  Donna Fowler, DOB 1976-04-27, MRN [id number]  PCP:  Tracie Harrier, MD  Cardiologist:  No primary care provider on file.  Electrophysiologist:  None   Referring MD: Linda Hedges, DO   Chief Complaint  Patient presents with  . New Patient (Initial Visit)    Pt concern w/ having heart rate increasing/ dizziness/ SOB. Meds verbally reviewed w/ pt.   Donna Fowler is a 44 y.o. female who is being seen today for the evaluation of shortness of breath and increased heart rate at the request of Morris, Megan, DO.   History of Present Illness:    Donna Fowler is a 44 y.o. female with a hx of hyperlipidemia, who is currently 40 and a half weeks pregnant who presents due to palpitations and shortness of breath. Patient states having symptoms of palpitations on and off not related with exertion. 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 2 weeks ago she noticed palpitations and shortness of breath. She was placed on telemetry monitor with heart rate recordings of 140s, reading was sinus tachycardia. She states not having symptoms prepregnancy. She has had occasional palpitations on and off since her pregnancy. She denies any chest pain when her heart rate is normal. She notes occasional shortness of breath which she attributes to being pregnant.  Past Medical History:  Diagnosis Date  . Arthritis   . Carpal tunnel syndrome on both sides   . Endocervical polyp   . Infertility, female     Past Surgical History:  Procedure Laterality Date  . COLONOSCOPY WITH ESOPHAGOGASTRODUODENOSCOPY (EGD)  2017  . DILATATION & CURETTAGE/HYSTEROSCOPY WITH MYOSURE N/A 06/18/2018   Procedure: DILATATION & CURETTAGE/HYSTEROSCOPY WITH MYOSURE;  Surgeon: Linda Hedges, DO;  Location: [facility];  Service: Gynecology;  Laterality: N/A;  . ELBOW ARTHROSCOPY Left 2014  . LAPAROSCOPIC CHOLECYSTECTOMY  2009    Current Medications: Current Meds    Medication Sig  . acetaminophen (TYLENOL) 650 MG CR tablet Take by mouth.  Marland Kitchen albuterol (PROVENTIL HFA;VENTOLIN HFA) 108 (90 Base) MCG/ACT inhaler Inhale 2 puffs into the lungs every 6 (six) hours as needed for wheezing or shortness of breath.  Marland Kitchen atorvastatin (LIPITOR) 10 MG tablet Take 10 mg by mouth every other day.   . busPIRone (BUSPAR) 15 MG tablet Take 15 mg by mouth every evening.   . cetirizine (ZYRTEC) 10 MG tablet Take 10 mg by mouth at bedtime.   . Cholecalciferol (VITAMIN D3) 1000 units CAPS Take 1 capsule by mouth daily.   . fluticasone (FLONASE) 50 MCG/ACT nasal spray Place 1 spray into the nose daily as needed for allergies or rhinitis.   . Multiple Vitamins-Minerals (MULTIVITAMIN GUMMIES ADULT PO) Take 2 tablets by mouth daily.  . pantoprazole (PROTONIX) 40 MG tablet Take 40 mg by mouth 2 (two) times daily.   . sertraline (ZOLOFT) 50 MG tablet Take 50 mg by mouth at bedtime.   . vitamin E 400 UNIT capsule Take 400 Units by mouth daily.     Allergies:   Patient has no known allergies.   Social History   Socioeconomic History  . Marital status: Married    Spouse name: Not on file  . Number of children: Not on file  . Years of education: Not on file  . Highest education level: Not on file  Occupational History  . Not on file  Tobacco Use  . Smoking status: Never Smoker  . Smokeless  tobacco: Never Used  Substance and Sexual Activity  . Alcohol use: Yes    Alcohol/week: 0.0 standard drinks    Comment: social  . Drug use: Never  . Sexual activity: Not on file  Other Topics Concern  . Not on file  Social History Narrative  . Not on file   Social Determinants of Health   Financial Resource Strain:   . Difficulty of Paying Living Expenses: Not on file  Food Insecurity:   . Worried About Charity fundraiser in the Last Year: Not on file  . Ran Out of Food in the Last Year: Not on file  Transportation Needs:   . Lack of Transportation (Medical): Not on file  .  Lack of Transportation (Non-Medical): Not on file  Physical Activity:   . Days of Exercise per Week: Not on file  . Minutes of Exercise per Session: Not on file  Stress:   . Feeling of Stress : Not on file  Social Connections:   . Frequency of Communication with Friends and Family: Not on file  . Frequency of Social Gatherings with Friends and Family: Not on file  . Attends Religious Services: Not on file  . Active Member of Clubs or Organizations: Not on file  . Attends Archivist Meetings: Not on file  . Marital Status: Not on file     Family History: The patient's family history includes Heart disease in her maternal grandfather and mother; Hypercholesterolemia in her father; Hypertension in her father.  ROS:   Please see the history of present illness.     All other systems reviewed and are negative.  EKGs/Labs/Other Studies Reviewed:    The following studies were reviewed today:   EKG:  EKG is  ordered today.  The ekg ordered today demonstrates normal sinus rhythm, normal EKG.  Recent Labs: No results found for requested labs within last 8760 hours.  Recent Lipid Panel No results found for: CHOL, TRIG, HDL, CHOLHDL, VLDL, LDLCALC, LDLDIRECT  Physical Exam:    VS:  BP 122/78 (BP Location: Left Arm, Patient Position: Sitting, Cuff Size: Normal)   Pulse 85   Ht 5\' 3"  (1.6 m)   Wt 198 lb 12 oz (90.2 kg)   SpO2 99%   BMI 35.21 kg/m     Wt Readings from Last 3 Encounters:  11/11/19 198 lb 12 oz (90.2 kg)  11/08/18 187 lb (84.8 kg)  06/18/18 183 lb 6.4 oz (83.2 kg)     GEN:  Well nourished, well developed in no acute distress HEENT: Normal NECK: No JVD; No carotid bruits LYMPHATICS: No lymphadenopathy CARDIAC: RRR, no murmurs, rubs, gallops RESPIRATORY:  Clear to auscultation without rales, wheezing or rhonchi  ABDOMEN: Soft, non-tender, non-distended MUSCULOSKELETAL:  No edema; No deformity  SKIN: Warm and dry NEUROLOGIC:  Alert and oriented x  3 PSYCHIATRIC:  Normal affect   ASSESSMENT:    1. Palpitations   2. Dyspnea, unspecified type   3. Pure hypercholesterolemia    PLAN:    In order of problems listed above:  1. Patient with history of palpitations, [redacted] weeks pregnant. Will place cardiac monitor to rule out any atrial arrhythmias. 2. [redacted] weeks pregnant, symptoms of shortness of breath. Will get echocardiogram to rule out any structural heart disease. 3. 3 of hyperlipidemia, continue Lipitor as prescribed.  Follow-up after echocardiogram and cardiac monitor.   Medication Adjustments/Labs and Tests Ordered: Current medicines are reviewed at length with the patient today.  Concerns regarding medicines  are outlined above.  Orders Placed This Encounter  Procedures  . LONG TERM MONITOR (3-14 DAYS)  . EKG 12-Lead  . ECHOCARDIOGRAM COMPLETE   No orders of the defined types were placed in this encounter.   Patient Instructions  Medication Instructions:  - Your physician recommends that you continue on your current medications as directed. Please refer to the Current Medication list given to you today.  *If you need a refill on your cardiac medications before your next appointment, please call your pharmacy*  Lab Work: - none ordered  If you have labs (blood work) drawn today and your tests are completely normal, you will receive your results only by: Marland Kitchen MyChart Message (if you have MyChart) OR . A paper copy in the mail If you have any lab test that is abnormal or we need to change your treatment, we will call you to review the results.  Testing/Procedures: - Your physician has requested that you have an echocardiogram (after 2 weeks- need to complete the Zio monitor). Echocardiography is a painless test that uses sound waves to create images of your heart. It provides your doctor with information about the size and shape of your heart and how well your heart's chambers and valves are working. This procedure takes  approximately one hour. There are no restrictions for this procedure.  - Your physician has recommended that you wear a 14 day Zio monitor (placed in office today). This monitor is a medical device that records the heart's electrical activity. Doctors most often use these monitors to diagnose arrhythmias. Arrhythmias are problems with the speed or rhythm of the heartbeat. The monitor is a small device applied to your chest. You can wear one while you do your normal daily activities. While wearing this monitor if you have any symptoms to push the button and record what you felt. Once you have worn this monitor for the period of time provider prescribed (Usually 14 days), you will return the monitor device in the postage paid box. Once it is returned they will download the data collected and provide [place] with a report which the provider will then review and we will call you with those results. Important tips:  1. Avoid showering during the first 24 hours of wearing the monitor. 2. Avoid excessive sweating to help maximize wear time. 3. Do not submerge the device, no hot tubs, and no swimming pools. 4. Keep any lotions or oils away from the patch. 5. After 24 hours you may shower with the patch on. Take brief showers with your back facing the shower head.  6. Do not remove patch once it has been placed because that will interrupt data and decrease adhesive wear time. 7. Push the button when you have any symptoms and write down what you were feeling. 8. Once you have completed wearing your monitor, remove and place into box which has postage paid and place in your outgoing mailbox.  9. If for some reason you have misplaced your box then call our office and we can provide another box and/or mail it off for you.        Follow-Up: At [facility], you and your health needs are our priority.  As part of our continuing mission to provide you with exceptional heart care, we have created designated  Provider Care Teams.  These Care Teams include your primary Cardiologist (physician) and Advanced Practice Providers (APPs -  Physician Assistants and Nurse Practitioners) who all work together to provide you with  the care you need, when you need it.  Your next appointment:   4-5 weeks/ after cardiac testing is completed   The format for your next appointment:   In Person  Provider:   Kate Sable, MD  Other Instructions  n/a   Echocardiogram An echocardiogram is a procedure that uses painless sound waves (ultrasound) to produce an image of the heart. Images from an echocardiogram can provide important information about:  Signs of coronary artery disease (CAD).  Aneurysm detection. An aneurysm is a weak or damaged part of an artery wall that bulges out from the normal force of blood pumping through the body.  Heart size and shape. Changes in the size or shape of the heart can be associated with certain conditions, including heart failure, aneurysm, and CAD.  Heart muscle function.  Heart valve function.  Signs of a past heart attack.  Fluid buildup around the heart.  Thickening of the heart muscle.  A tumor or infectious growth around the heart valves. Tell a health care provider about:  Any allergies you have.  All medicines you are taking, including vitamins, herbs, eye drops, creams, and over-the-counter medicines.  Any blood disorders you have.  Any surgeries you have had.  Any medical conditions you have.  Whether you are pregnant or may be pregnant. What are the risks? Generally, this is a safe procedure. However, problems may occur, including:  Allergic reaction to dye (contrast) that may be used during the procedure. What happens before the procedure? No specific preparation is needed. You may eat and drink normally. What happens during the procedure?   An IV tube may be inserted into one of your veins.  You may receive contrast through this  tube. A contrast is an injection that improves the quality of the pictures from your heart.  A gel will be applied to your chest.  A wand-like tool (transducer) will be moved over your chest. The gel will help to transmit the sound waves from the transducer.  The sound waves will harmlessly bounce off of your heart to allow the heart images to be captured in real-time motion. The images will be recorded on a computer. The procedure may vary among health care providers and hospitals. What happens after the procedure?  You may return to your normal, everyday life, including diet, activities, and medicines, unless your health care provider tells you not to do that. Summary  An echocardiogram is a procedure that uses painless sound waves (ultrasound) to produce an image of the heart.  Images from an echocardiogram can provide important information about the size and shape of your heart, heart muscle function, heart valve function, and fluid buildup around your heart.  You do not need to do anything to prepare before this procedure. You may eat and drink normally.  After the echocardiogram is completed, you may return to your normal, everyday life, unless your health care provider tells you not to do that. This information is not intended to replace advice given to you by your health care provider. Make sure you discuss any questions you have with your health care provider. Document Revised: 12/30/2018 Document Reviewed: 10/11/2016 Elsevier Patient Education  2020 [place], Kate Sable, MD  11/11/2019 4:38 PM    [place]

## 2019-11-14 ENCOUNTER — Other Ambulatory Visit (HOSPITAL_COMMUNITY): Payer: Self-pay | Admitting: Obstetrics & Gynecology

## 2019-11-14 DIAGNOSIS — O280 Abnormal hematological finding on antenatal screening of mother: Secondary | ICD-10-CM

## 2019-11-14 DIAGNOSIS — Z3689 Encounter for other specified antenatal screening: Secondary | ICD-10-CM

## 2019-11-16 ENCOUNTER — Ambulatory Visit: Payer: No Typology Code available for payment source | Admitting: Gastroenterology

## 2019-11-16 ENCOUNTER — Other Ambulatory Visit: Payer: Self-pay

## 2019-11-16 VITALS — BP 125/82 | HR 92 | Temp 98.2°F | Ht 63.0 in | Wt 199.8 lb

## 2019-11-16 DIAGNOSIS — M549 Dorsalgia, unspecified: Secondary | ICD-10-CM | POA: Diagnosis not present

## 2019-11-16 DIAGNOSIS — R1012 Left upper quadrant pain: Secondary | ICD-10-CM | POA: Diagnosis not present

## 2019-11-16 DIAGNOSIS — G8929 Other chronic pain: Secondary | ICD-10-CM

## 2019-11-16 DIAGNOSIS — K219 Gastro-esophageal reflux disease without esophagitis: Secondary | ICD-10-CM

## 2019-11-16 MED ORDER — SUCRALFATE 1 GM/10ML PO SUSP: 1.0000 g | Freq: Three times a day (TID) | ORAL | 1 refills | Status: DC

## 2019-11-16 NOTE — Progress Notes (Signed)
[name] MD, MRCP(U.K) [address]  [place]  [place], [place] [address]  Main: [phone]  Fax: [phone]   Gastroenterology Consultation  Referring Provider:     Tracie Harrier, MD Primary Care Physician:  Tracie Harrier, MD Primary Gastroenterologist:  Dr. [name]  Reason for Consultation:     GERD        HPI:   Donna Fowler is a 44 y.o. y/o female referred for consultation & management  by Dr. Tracie Harrier, MD.    Is here to see me for left-sided abdominal pain which she has had for many years with no change.  She has had a cholecystectomy as well many years back for nausea and vomiting.  She says that the pain radiating comes on and off.  Usually worse after a meal lasting about 30 to 45 minutes in duration cramping in nature sometimes radiating to her back.  Relieved to a great deal after a good bowel movement.  She denies being constipated.  I went through her meals and approximate value of 10 g of fiber was noted in her meals the prior day.  She is presently pregnant [redacted] weeks.  She also has acid reflux longstanding takes Protonix twice a day.  It controls her symptoms to a good degree.  She is not sure if the pain in the left upper part of her body is similar to the pain that she had when she had her gallbladder taken out.  Denies any NSAID use.  She recollects that she has had evaluation for gastroparesis in [place] many years back as well as an endoscopy where she was noted to have an ulcer while not on NSAIDs and was tested for H. pylori and recollect she was negative.    Past Medical History:  Diagnosis Date  . Arthritis   . Carpal tunnel syndrome on both sides   . Endocervical polyp   . Infertility, female     Past Surgical History:  Procedure Laterality Date  . COLONOSCOPY WITH ESOPHAGOGASTRODUODENOSCOPY (EGD)  2017  . DILATATION & CURETTAGE/HYSTEROSCOPY WITH MYOSURE N/A 06/18/2018   Procedure: DILATATION & CURETTAGE/HYSTEROSCOPY  WITH MYOSURE;  Surgeon: Linda Hedges, DO;  Location: [facility];  Service: Gynecology;  Laterality: N/A;  . ELBOW ARTHROSCOPY Left 2014  . LAPAROSCOPIC CHOLECYSTECTOMY  2009    Prior to Admission medications   Medication Sig Start Date End Date Taking? Authorizing Provider  acetaminophen (TYLENOL) 650 MG CR tablet Take by mouth.    [provider]  albuterol (PROVENTIL HFA;VENTOLIN HFA) 108 (90 Base) MCG/ACT inhaler Inhale 2 puffs into the lungs every 6 (six) hours as needed for wheezing or shortness of breath. 11/08/18   Tenna Delaine D, PA-C  atorvastatin (LIPITOR) 10 MG tablet Take 10 mg by mouth every other day.     [provider]  busPIRone (BUSPAR) 15 MG tablet Take 15 mg by mouth every evening.  10/05/17   [provider]  cetirizine (ZYRTEC) 10 MG tablet Take 10 mg by mouth at bedtime.     [provider]  Cholecalciferol (VITAMIN D3) 1000 units CAPS Take 1 capsule by mouth daily.     [provider]  fluticasone (FLONASE) 50 MCG/ACT nasal spray Place 1 spray into the nose daily as needed for allergies or rhinitis.  05/02/15   [provider]  Multiple Vitamins-Minerals (MULTIVITAMIN GUMMIES ADULT PO) Take 2 tablets by mouth daily.    [provider]  pantoprazole (PROTONIX) 40  MG tablet Take 40 mg by mouth 2 (two) times daily.     [provider]  sertraline (ZOLOFT) 50 MG tablet Take 50 mg by mouth at bedtime.  10/05/17   [provider]  vitamin E 400 UNIT capsule Take 400 Units by mouth daily.    [provider]    Family History  Problem Relation Age of Onset  . Hypercholesterolemia Father   . Hypertension Father   . Heart disease Mother        CABG  . Heart disease Maternal Grandfather        CABG     Social History   Tobacco Use  . Smoking status: Never Smoker  . Smokeless tobacco: Never Used  Substance Use Topics  . Alcohol use: Yes    Alcohol/week: 0.0  standard drinks    Comment: social  . Drug use: Never    Allergies as of 11/16/2019  . (No Known Allergies)    Review of Systems:    All systems reviewed and negative except where noted in HPI.   Physical Exam:  There were no vitals taken for this visit. No LMP recorded. Psych:  Alert and cooperative. Normal mood and affect. General:   Alert,  Well-developed, well-nourished, pleasant and cooperative in NAD Head:  Normocephalic and atraumatic. Abdomen: No tenderness in the left upper quadrant.  She is tender over her left thoracic paraspinal muscles and it reproduces the pain in her back which radiates to the front. Neurologic:  Alert and oriented x3;  grossly normal neurologically. Psych:  Alert and cooperative. Normal mood and affect.  Imaging Studies: No results found.  Assessment and Plan:   [name] is a 44 y.o. y/o female has been referred for left upper quadrant pain longstanding for many years no recent progression.  She is [redacted] weeks pregnant.  She has had a cholecystectomy in the past.  She also has coexisting acid reflux.  Presently on PPI Protonix 40 mg twice daily.  The left upper quadrant pain differential diagnosis would include IBS constipation versus splenic flexure syndrome versus possibility of a stone in the common bile duct although less likely as she has had this for many years and her liver function tests have always been normal when checked previously.  It could also be related to back pain radiating to the front tenderness was elicited on examination.  She also does have coexisting acid reflux probably exacerbated by pregnancy.   Plan 1.  Suggested high-fiber diet with a target of 20 to 25 g/day.  Went through changes in her diet and will provide her a printout from [facility] to reach her target. 2.  In terms of acid reflux I would suggest we try her on Carafate 3 times a day and if it fails add Pepcid onto it.  Although Protonix is probably safe in  pregnancy the data is limited and I would prefer to use it only if other options have not worked.  I have advised her to send me a message via MyChart if she does not obtain any relief with Carafate or Pepcid. 3.  I suggested that after her pregnancy she returned to my office to be evaluated further we can discuss testing for H. pylori with a breath test, consider MRI to evaluate her bile ducts and consider endoscopy if needed.  Explained that during pregnancy 1 would try to avoid any endoscopy procedures unless absolutely required.   Follow up after pregnancy she will call to  schedule the appointment  Dr [name] MD,MRCP(U.K)

## 2019-11-24 ENCOUNTER — Encounter (HOSPITAL_COMMUNITY): Payer: Self-pay | Admitting: *Deleted

## 2019-11-29 ENCOUNTER — Ambulatory Visit (HOSPITAL_COMMUNITY): Payer: No Typology Code available for payment source

## 2019-11-29 ENCOUNTER — Encounter (HOSPITAL_COMMUNITY): Payer: Self-pay

## 2019-12-02 ENCOUNTER — Other Ambulatory Visit: Payer: Self-pay

## 2019-12-02 ENCOUNTER — Ambulatory Visit (INDEPENDENT_AMBULATORY_CARE_PROVIDER_SITE_OTHER): Payer: No Typology Code available for payment source

## 2019-12-02 DIAGNOSIS — R0600 Dyspnea, unspecified: Secondary | ICD-10-CM | POA: Diagnosis not present

## 2019-12-06 ENCOUNTER — Ambulatory Visit (INDEPENDENT_AMBULATORY_CARE_PROVIDER_SITE_OTHER): Payer: No Typology Code available for payment source | Admitting: Cardiology

## 2019-12-06 ENCOUNTER — Other Ambulatory Visit: Payer: Self-pay

## 2019-12-06 ENCOUNTER — Encounter: Payer: Self-pay | Admitting: Cardiology

## 2019-12-06 VITALS — BP 120/70 | HR 101 | Temp 97.8°F | Ht 63.0 in | Wt 199.0 lb

## 2019-12-06 DIAGNOSIS — R002 Palpitations: Secondary | ICD-10-CM | POA: Diagnosis not present

## 2019-12-06 DIAGNOSIS — R0600 Dyspnea, unspecified: Secondary | ICD-10-CM | POA: Diagnosis not present

## 2019-12-06 DIAGNOSIS — E7800 Pure hypercholesterolemia, unspecified: Secondary | ICD-10-CM | POA: Diagnosis not present

## 2019-12-06 NOTE — Patient Instructions (Signed)
Medication Instructions:  Your physician recommends that you continue on your current medications as directed. Please refer to the Current Medication list given to you today.  *If you need a refill on your cardiac medications before your next appointment, please call your pharmacy*  Follow-Up: At [facility], you and your health needs are our priority.  As part of our continuing mission to provide you with exceptional heart care, we have created designated Provider Care Teams.  These Care Teams include your primary Cardiologist (physician) and Advanced Practice Providers (APPs -  Physician Assistants and Nurse Practitioners) who all work together to provide you with the care you need, when you need it.  We recommend signing up for the patient portal called "MyChart".  Sign up information is provided on this After Visit Summary.  MyChart is used to connect with patients for Virtual Visits (Telemedicine).  Patients are able to view lab/test results, encounter notes, upcoming appointments, etc.  Non-urgent messages can be sent to your provider as well.   To learn more about what you can do with MyChart, go to [email or web address].    Your next appointment:   As needed.  The format for your next appointment:   In Person  Provider:   Brian Agbor-Etang, MD  

## 2019-12-06 NOTE — Progress Notes (Signed)
Cardiology Office Note:    Date:  12/06/2019   ID:  Donna Fowler, DOB 10/28/1975, MRN [id number]  PCP:  Tracie Harrier, MD  Cardiologist:  No primary care provider on file.  Electrophysiologist:  None   Referring MD: Tracie Harrier, MD   Chief Complaint  Patient presents with  . other    Follow up Ione monitor. Meds reviewed by the pt. verbally. Pt. c/o most palpitations mostly in the am with rapid heart beats.     History of Present Illness:    Donna Fowler is a 44 y.o. female with a hx of hyperlipidemia, who is currently [redacted] weeks pregnant who presents for follow-up.  She was last seen due to palpitations and shortness of breath.   Patient had symptoms of palpitations on and off not related with exertion. 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  One of the episodes was captured on telemetry while at work with heart rate recordings of 140s, reading was sinus tachycardia.  She states not having symptoms prepregnancy.  Echocardiogram and cardiac monitor was ordered.  Past Medical History:  Diagnosis Date  . Arthritis   . Carpal tunnel syndrome on both sides   . Endocervical polyp   . Infertility, female     Past Surgical History:  Procedure Laterality Date  . COLONOSCOPY WITH ESOPHAGOGASTRODUODENOSCOPY (EGD)  2017  . DILATATION & CURETTAGE/HYSTEROSCOPY WITH MYOSURE N/A 06/18/2018   Procedure: DILATATION & CURETTAGE/HYSTEROSCOPY WITH MYOSURE;  Surgeon: Linda Hedges, DO;  Location: [facility];  Service: Gynecology;  Laterality: N/A;  . ELBOW ARTHROSCOPY Left 2014  . LAPAROSCOPIC CHOLECYSTECTOMY  2009    Current Medications: Current Meds  Medication Sig  . acetaminophen (TYLENOL) 650 MG CR tablet Take by mouth.  Marland Kitchen albuterol (PROVENTIL HFA;VENTOLIN HFA) 108 (90 Base) MCG/ACT inhaler Inhale 2 puffs into the lungs every 6 (six) hours as needed for wheezing or shortness of breath.  . busPIRone (BUSPAR) 15 MG tablet Take 15 mg by mouth every evening.   .  cetirizine (ZYRTEC) 10 MG tablet Take 10 mg by mouth at bedtime.   . Cholecalciferol (VITAMIN D3) 1000 units CAPS Take 1 capsule by mouth daily.   . fluticasone (FLONASE) 50 MCG/ACT nasal spray Place 1 spray into the nose daily as needed for allergies or rhinitis.   . Multiple Vitamins-Minerals (MULTIVITAMIN GUMMIES ADULT PO) Take 2 tablets by mouth daily.  . pantoprazole (PROTONIX) 40 MG tablet Take 40 mg by mouth 2 (two) times daily.   . Prenatal Vit-Fe Fumarate-FA (PRENATAL MULTIVITAMIN) TABS tablet Take 1 tablet by mouth daily at 12 noon.  . sertraline (ZOLOFT) 50 MG tablet Take 50 mg by mouth at bedtime.   . sucralfate (CARAFATE) 1 GM/10ML suspension Take 10 mLs (1 g total) by mouth 3 (three) times daily. As needed  . vitamin E 400 UNIT capsule Take 400 Units by mouth daily.     Allergies:   Patient has no known allergies.   Social History   Socioeconomic History  . Marital status: Married    Spouse name: Not on file  . Number of children: Not on file  . Years of education: Not on file  . Highest education level: Not on file  Occupational History  . Not on file  Tobacco Use  . Smoking status: Never Smoker  . Smokeless tobacco: Never Used  Substance and Sexual Activity  . Alcohol use: Yes    Alcohol/week: 0.0 standard drinks    Comment: social  . Drug  use: Never  . Sexual activity: Not on file  Other Topics Concern  . Not on file  Social History Narrative  . Not on file   Social Determinants of Health   Financial Resource Strain:   . Difficulty of Paying Living Expenses:   Food Insecurity:   . Worried About Charity fundraiser in the Last Year:   . Arboriculturist in the Last Year:   Transportation Needs:   . Film/video editor (Medical):   Marland Kitchen Lack of Transportation (Non-Medical):   Physical Activity:   . Days of Exercise per Week:   . Minutes of Exercise per Session:   Stress:   . Feeling of Stress :   Social Connections:   . Frequency of Communication  with Friends and Family:   . Frequency of Social Gatherings with Friends and Family:   . Attends Religious Services:   . Active Member of Clubs or Organizations:   . Attends Archivist Meetings:   Marland Kitchen Marital Status:      Family History: The patient's family history includes Heart disease in her maternal grandfather and mother; Hypercholesterolemia in her father; Hypertension in her father.  ROS:   Please see the history of present illness.     All other systems reviewed and are negative.  EKGs/Labs/Other Studies Reviewed:    The following studies were reviewed today: 2-week cardiac monitor 11/11/2019 Patient had a min HR of 63 bpm, max HR of 160 bpm, and avg HR of 97 bpm. Predominant underlying rhythm was Sinus Rhythm. 1 run of Ventricular Tachycardia occurred lasting 5 beats with a max rate of 128 bpm (avg 120 bpm). Isolated SVEs were rare (<1.0%), SVE Couplets were rare (<1.0%).  Overall benign cardiac exam/monitor.   Patient triggered events were associated with sinus rhythm or sinus tachycardia.  EKG:  EKG is  ordered today.  The ekg ordered today demonstrates sinus tachycardia, otherwise normal ECG.  Rate 101  Recent Labs: No results found for requested labs within last 8760 hours.  Recent Lipid Panel No results found for: CHOL, TRIG, HDL, CHOLHDL, VLDL, LDLCALC, LDLDIRECT  Physical Exam:    VS:  BP 120/70 (BP Location: Left Arm, Patient Position: Sitting, Cuff Size: Normal)   Pulse (!) 101   Temp 97.8 F (36.6 C)   Ht 5\' 3"  (1.6 m)   Wt 199 lb (90.3 kg)   LMP 07/31/2019   SpO2 99%   BMI 35.25 kg/m     Wt Readings from Last 3 Encounters:  12/06/19 199 lb (90.3 kg)  11/16/19 199 lb 12.8 oz (90.6 kg)  11/11/19 198 lb 12 oz (90.2 kg)     GEN:  Well nourished, well developed in no acute distress HEENT: Normal NECK: No JVD; No carotid bruits LYMPHATICS: No lymphadenopathy CARDIAC: RRR, no murmurs, rubs, gallops RESPIRATORY:  Clear to auscultation  without rales, wheezing or rhonchi  ABDOMEN: Soft, non-tender, non-distended MUSCULOSKELETAL:  No edema; No deformity  SKIN: Warm and dry NEUROLOGIC:  Alert and oriented x 3 PSYCHIATRIC:  Normal affect   ASSESSMENT:    1. Dyspnea, unspecified type   2. Palpitations   3. Pure hypercholesterolemia    PLAN:    In order of problems listed above:  1. Patient with history of palpitations.  2-week cardiac monitor showed no significant sustained arrhythmias. patient triggered events were associated with sinus rhythm or sinus tachycardia.  Patient reassured. 2. Echocardiogram showed normal systolic and diastolic function with EF.  60 to  65%.  Normal study with no structural abnormalities noted.  Patient reassured. 3. 3 of hyperlipidemia, continue Lipitor as prescribed.  Follow-up as needed   Medication Adjustments/Labs and Tests Ordered: Current medicines are reviewed at length with the patient today.  Concerns regarding medicines are outlined above.  No orders of the defined types were placed in this encounter.  No orders of the defined types were placed in this encounter.   There are no Patient Instructions on file for this visit.   Signed, Kate Sable, MD  12/06/2019 8:26 AM    [place]

## 2020-01-17 ENCOUNTER — Other Ambulatory Visit: Payer: Self-pay | Admitting: Internal Medicine

## 2020-02-13 ENCOUNTER — Encounter: Payer: No Typology Code available for payment source | Attending: Obstetrics & Gynecology | Admitting: *Deleted

## 2020-02-13 ENCOUNTER — Encounter: Payer: Self-pay | Admitting: *Deleted

## 2020-02-13 ENCOUNTER — Other Ambulatory Visit: Payer: Self-pay

## 2020-02-13 VITALS — BP 112/70 | Ht 63.0 in | Wt 210.9 lb

## 2020-02-13 DIAGNOSIS — O24410 Gestational diabetes mellitus in pregnancy, diet controlled: Secondary | ICD-10-CM

## 2020-02-14 NOTE — Progress Notes (Signed)
Diabetes Self-Management Education  Visit Type: First/Initial  Appt. Start Time: 1450 Appt. End Time: 1600  02/13/2020  [name], identified by name and date of birth, is a 44 y.o. female with a diagnosis of Diabetes: Gestational Diabetes.   ASSESSMENT  Blood pressure 112/70, height 5\' 3"  (1.6 m), weight 210 lb 14.4 oz (95.7 kg), last menstrual period 07/31/2019, estimated date of delivery 05/06/2020 Body mass index is 37.36 kg/m.  Diabetes Self-Management Education - 02/13/20 1803      Visit Information   Visit Type  First/Initial      Initial Visit   Diabetes Type  Gestational Diabetes    Are you currently following a meal plan?  Yes    What type of meal plan do you follow?  "decrease carbs (bread), limit milk intake, baked foods    Are you taking your medications as prescribed?  Yes    Date Diagnosed  last week      Health Coping   How would you rate your overall health?  Good      Psychosocial Assessment   Patient Belief/Attitude about Diabetes  Motivated to manage diabetes    Self-care barriers  None    Self-management support  Doctor's office;Family    Patient Concerns  Nutrition/Meal planning;Glycemic Control;Monitoring;Weight Control;Healthy Lifestyle    Special Needs  None    Preferred Learning Style  Visual;Auditory    Learning Readiness  Change in progress    How often do you need to have someone help you when you read instructions, pamphlets, or other written materials from your doctor or pharmacy?  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  Needs Review    Patient understands incorporating nutritional management into lifestyle.  Needs Instruction    Patient undertands incorporating physical activity into lifestyle.  Needs Review    Patient understands using medications safely.  Needs Review    Patient understands  monitoring blood glucose, interpreting and using results  Needs Instruction    Patient understands prevention, detection, and treatment of acute complications.  Needs Review    Patient understands prevention, detection, and treatment of chronic complications.  Needs Instruction    Patient understands how to develop strategies to address psychosocial issues.  Needs Instruction    Patient understands how to develop strategies to promote health/change behavior.  Needs Instruction      Complications   Last HgB A1C per patient/outside source  6.2 %   01/09/2020   How often do you check your blood sugar?  0 times/day (not testing)   Pt's insurance requires meter that this office does not have. Instructed her on demo meter and she has called her MD office to call in prescription to pharmacy.   Have you had a dilated eye exam in the past 12 months?  No    Have you had a dental exam in the past 12 months?  Yes    Are you checking your feet?  Yes    How many days per week are you checking your feet?  7      Dietary Intake   Breakfast  6:00 am -milk ; 8:30 am - egg white omelet with English muffin and hashbrown or cereal and milk    Snack (morning)  Kind bar, Greek yogurt, fruit (orange, apple, banana    Lunch  vegetarian diet - veggie wrap; veggie pizza; salad with lettuce, tomatoes, cheese, onions, peppers    Snack (afternoon)  homemade Chex mix    Dinner  beans, tofu, tortilla, beans, lentil soup with spinach - curry, yogurt, milk, cumin; cabbage, rice, beans, green beans, okra, cauliflower    Beverage(s)  water, milk, chi tea      Exercise   Exercise Type  Light (walking / raking leaves)    How many days per week to you exercise?  3    How many minutes per day do you exercise?  40    Total minutes per week of exercise  120      Patient Education   Previous Diabetes Education  Yes (please comment)   nursing school   Disease state   Definition of diabetes, type 1 and 2, and the diagnosis of  diabetes;Factors that contribute to the development of diabetes    Nutrition management   Role of diet in the treatment of diabetes and the relationship between the three main macronutrients and blood glucose level;Food label reading, portion sizes and measuring food.;Reviewed blood glucose goals for pre and post meals and how to evaluate the patients' food intake on their blood glucose level.    Physical activity and exercise   Role of exercise on diabetes management, blood pressure control and cardiac health.    Medications  Other (comment)   Limited use of oral medications during pregnancy and possibility of insulin   Monitoring  Taught/evaluated SMBG meter.;Purpose and frequency of SMBG.;Taught/discussed recording of test results and interpretation of SMBG.;Identified appropriate SMBG and/or A1C goals.;Ketone testing, when, how.    Chronic complications  Relationship between chronic complications and blood glucose control    Psychosocial adjustment  Identified and addressed patients feelings and concerns about diabetes    Preconception care  Pregnancy and GDM  Role of pre-pregnancy blood glucose control on the development of the fetus;Reviewed with patient blood glucose goals with pregnancy;Role of family planning for patients with diabetes      Individualized Goals (developed by patient)   Reducing Risk  Other (comment)   improve blood sugars, prevent diabetes complications, lose weight, lead a healthier lifestyle     Outcomes   Expected Outcomes  Demonstrated interest in learning. Expect positive outcomes       Individualized Plan for Diabetes Self-Management Training:   Learning Objective:  Patient will have a greater understanding of diabetes self-management. Patient education plan is to attend individual and/or group sessions per assessed needs and concerns.   Plan:   Patient Instructions  Read booklet on Gestational Diabetes Follow Gestational Meal Planning Guidelines Avoid  cold cereal for breakfast Complete a 3 Day Food Record and bring to next appointment Check blood sugars 4 x day - before breakfast and 2 hrs after every meal and record  Bring blood sugar log to all appointments Call MD for prescription for meter strips and lancets Strips   FreeStyle Lite  Lancets   FreeStyle Purchase urine ketone strips if instructed by MD and check urine ketones every am:  If + increase bedtime snack to 1 protein and 2 carbohydrate servings Walk 20-30 minutes at least 5 x week if permitted by MD  Expected Outcomes:  Demonstrated interest in learning. Expect positive outcomes  Education material provided:  Gestational Booklet Gestational Meal Planning Guidelines Simple Meal Plan Viewed Gestational Diabetes Video - declined (nurse) 3 Day Food Record Goals for a Healthy Pregnancy  If problems or questions, patient to contact  team via:  Johny Drilling, RN, [place], [place] [phone]  Future DSME appointment:  February 23, 2020 with the dietitian

## 2020-02-14 NOTE — Patient Instructions (Signed)
Read booklet on Gestational Diabetes Follow Gestational Meal Planning Guidelines Avoid cold cereal for breakfast Complete a 3 Day Food Record and bring to next appointment Check blood sugars 4 x day - before breakfast and 2 hrs after every meal and record  Bring blood sugar log to all appointments Call MD for prescription for meter strips and lancets Strips   FreeStyle Lite  Lancets   FreeStyle Purchase urine ketone strips if instructed by MD and check urine ketones every am:  If + increase bedtime snack to 1 protein and 2 carbohydrate servings Walk 20-30 minutes at least 5 x week if permitted by MD

## 2020-02-23 ENCOUNTER — Encounter: Payer: No Typology Code available for payment source | Attending: Internal Medicine | Admitting: Dietician

## 2020-02-23 ENCOUNTER — Encounter: Payer: Self-pay | Admitting: Dietician

## 2020-02-23 ENCOUNTER — Other Ambulatory Visit: Payer: Self-pay

## 2020-02-23 VITALS — Ht 63.0 in | Wt 211.9 lb

## 2020-02-23 DIAGNOSIS — O24419 Gestational diabetes mellitus in pregnancy, unspecified control: Secondary | ICD-10-CM | POA: Diagnosis present

## 2020-02-23 DIAGNOSIS — O24410 Gestational diabetes mellitus in pregnancy, diet controlled: Secondary | ICD-10-CM

## 2020-02-23 DIAGNOSIS — Z3A00 Weeks of gestation of pregnancy not specified: Secondary | ICD-10-CM | POA: Insufficient documentation

## 2020-02-23 NOTE — Patient Instructions (Signed)
   Try to incorporate beans, tofu, tempeh at dinner meals   Include a protein with snacks   Keep up the good work!

## 2020-02-23 NOTE — Progress Notes (Signed)
.   Patient's BG record indicates fasting BGs ranging 96-119 , and post-meal BGs ranging 78-168 . Patient's food diary indicates need to include protein sources more consistently at meals and snacks  . Wrote individualized menus based on patient's food preferences. . Instructed patient on food safety, including avoidance of Listeriosis, and limiting mercury from fish. . Discussed importance of maintaining healthy lifestyle habits to reduce risk of Type 2 DM as well as Gestational DM with any future pregnancies. . Advised patient to use any remaining testing supplies to test some BGs after delivery, and to have BG tested ideally annually, as well as prior to attempting future pregnancies.

## 2020-04-05 LAB — OB RESULTS CONSOLE GBS: GBS: POSITIVE

## 2020-04-11 ENCOUNTER — Inpatient Hospital Stay (HOSPITAL_COMMUNITY)
Admission: AD | Admit: 2020-04-11 | Discharge: 2020-04-11 | Disposition: A | Discharge status: Home / Self Care | Payer: No Typology Code available for payment source | Attending: Obstetrics and Gynecology | Admitting: Obstetrics and Gynecology

## 2020-04-11 ENCOUNTER — Encounter (HOSPITAL_COMMUNITY): Payer: Self-pay | Admitting: Obstetrics and Gynecology

## 2020-04-11 DIAGNOSIS — O26893 Other specified pregnancy related conditions, third trimester: Secondary | ICD-10-CM

## 2020-04-11 DIAGNOSIS — O133 Gestational [pregnancy-induced] hypertension without significant proteinuria, third trimester: Secondary | ICD-10-CM | POA: Diagnosis not present

## 2020-04-11 DIAGNOSIS — O10913 Unspecified pre-existing hypertension complicating pregnancy, third trimester: Secondary | ICD-10-CM | POA: Diagnosis not present

## 2020-04-11 DIAGNOSIS — R519 Headache, unspecified: Secondary | ICD-10-CM | POA: Diagnosis not present

## 2020-04-11 DIAGNOSIS — Z3A36 36 weeks gestation of pregnancy: Secondary | ICD-10-CM | POA: Insufficient documentation

## 2020-04-11 DIAGNOSIS — O479 False labor, unspecified: Secondary | ICD-10-CM | POA: Diagnosis not present

## 2020-04-11 DIAGNOSIS — Z833 Family history of diabetes mellitus: Secondary | ICD-10-CM | POA: Diagnosis not present

## 2020-04-11 DIAGNOSIS — I10 Essential (primary) hypertension: Secondary | ICD-10-CM

## 2020-04-11 DIAGNOSIS — Z79899 Other long term (current) drug therapy: Secondary | ICD-10-CM | POA: Insufficient documentation

## 2020-04-11 DIAGNOSIS — Z8249 Family history of ischemic heart disease and other diseases of the circulatory system: Secondary | ICD-10-CM | POA: Insufficient documentation

## 2020-04-11 DIAGNOSIS — O4700 False labor before 37 completed weeks of gestation, unspecified trimester: Secondary | ICD-10-CM

## 2020-04-11 DIAGNOSIS — O24419 Gestational diabetes mellitus in pregnancy, unspecified control: Secondary | ICD-10-CM | POA: Insufficient documentation

## 2020-04-11 LAB — COMPREHENSIVE METABOLIC PANEL
ALT: 16 U/L (ref 0–44)
AST: 19 U/L (ref 15–41)
Albumin: 2.6 g/dL — ABNORMAL LOW (ref 3.5–5.0)
Alkaline Phosphatase: 92 U/L (ref 38–126)
Anion gap: 12 (ref 5–15)
BUN: 8 mg/dL (ref 6–20)
CO2: 16 mmol/L — ABNORMAL LOW (ref 22–32)
Calcium: 9.1 mg/dL (ref 8.9–10.3)
Chloride: 106 mmol/L (ref 98–111)
Creatinine, Ser: 0.65 mg/dL (ref 0.44–1.00)
GFR calc Af Amer: >60 mL/min (ref >60)
GFR calc non Af Amer: >60 mL/min (ref >60)
Glucose, Bld: 137 mg/dL — ABNORMAL HIGH (ref 70–99)
Potassium: 4 mmol/L (ref 3.5–5.1)
Sodium: 134 mmol/L — ABNORMAL LOW (ref 135–145)
Total Bilirubin: 0.6 mg/dL (ref 0.3–1.2)
Total Protein: 6.1 g/dL — ABNORMAL LOW (ref 6.5–8.1)

## 2020-04-11 LAB — CBC
HCT: 36.6 % (ref 36.0–46.0)
Hemoglobin: 12.2 g/dL (ref 12.0–15.0)
MCH: 27.9 pg (ref 26.0–34.0)
MCHC: 33.3 g/dL (ref 30.0–36.0)
MCV: 83.8 fL (ref 80.0–100.0)
Platelets: 360 10*3/uL (ref 150–400)
RBC: 4.37 MIL/uL (ref 3.87–5.11)
RDW: 16.2 % — ABNORMAL HIGH (ref 11.5–15.5)
WBC: 9 10*3/uL (ref 4.0–10.5)
nRBC: 0 % (ref 0.0–0.2)

## 2020-04-11 LAB — URINALYSIS, ROUTINE W REFLEX MICROSCOPIC
Bilirubin Urine: NEGATIVE
Glucose, UA: NEGATIVE mg/dL
Hgb urine dipstick: NEGATIVE
Ketones, ur: NEGATIVE mg/dL
Leukocytes,Ua: NEGATIVE
Nitrite: NEGATIVE
Protein, ur: NEGATIVE mg/dL
Specific Gravity, Urine: 1.006 (ref 1.005–1.030)
pH: 6 (ref 5.0–8.0)

## 2020-04-11 LAB — PROTEIN / CREATININE RATIO, URINE
Creatinine, Urine: 26.34 mg/dL
Total Protein, Urine: <6 mg/dL

## 2020-04-11 MED ORDER — NIFEDIPINE 10 MG PO CAPS
10.0000 mg | ORAL_CAPSULE | ORAL | Status: DC | PRN
  Administered 2020-04-11 (×2): 10 mg via ORAL
  Filled 2020-04-11 (×2): qty 1

## 2020-04-11 MED ORDER — METOCLOPRAMIDE HCL 10 MG PO TABS
10.0000 mg | ORAL_TABLET | Freq: Once | ORAL | Status: AC
  Administered 2020-04-11: 10 mg via ORAL
  Filled 2020-04-11: qty 1

## 2020-04-11 MED ORDER — DIPHENHYDRAMINE HCL 25 MG PO CAPS
25.0000 mg | ORAL_CAPSULE | Freq: Once | ORAL | Status: AC
  Administered 2020-04-11: 25 mg via ORAL
  Filled 2020-04-11: qty 1

## 2020-04-11 NOTE — MAU Note (Signed)
Patient reports HA since 1900 that will not improve with tylenol.  Bps around 137/83 and repeat was 139/90.  Denies hx of HTN.  Denies epigastric pain or visual disturbances.  Also reports increased swelling in her feet.

## 2020-04-11 NOTE — Discharge Instructions (Signed)
Hypertension During Pregnancy °Hypertension is also called high blood pressure. High blood pressure means that the force of your blood moving in your body is too strong. It can cause problems for you and your baby. Different types of high blood pressure can happen during pregnancy. The types are: °· High blood pressure before you got pregnant. This is called chronic hypertension.  This can continue during your pregnancy. Your doctor will want to keep checking your blood pressure. You may need medicine to keep your blood pressure under control while you are pregnant. You will need follow-up visits after you have your baby. °· High blood pressure that goes up during pregnancy when it was normal before. This is called gestational hypertension. It will usually get better after you have your baby, but your doctor will need to watch your blood pressure to make sure that it is getting better. °· Very high blood pressure during pregnancy. This is called preeclampsia. Very high blood pressure is an emergency that needs to be checked and treated right away. °· You may develop very high blood pressure after giving birth. This is called postpartum preeclampsia. This usually occurs within 48 hours after childbirth but may occur up to 6 weeks after giving birth. This is rare. °How does this affect me? °If you have high blood pressure during pregnancy, you have a higher chance of developing high blood pressure: °· As you get older. °· If you get pregnant again. °In some cases, high blood pressure during pregnancy can cause: °· Stroke. °· Heart attack. °· Damage to the kidneys, lungs, or liver. °· Preeclampsia. °· Jerky movements you cannot control (convulsions or seizures). °· Problems with the placenta. °How does this affect my baby? °Your baby may: °· Be born early. °· Not weigh as much as he or she should. °· Not handle labor well, leading to a c-section birth. °What are the risks? °· Having high blood pressure during a past  pregnancy. °· Being overweight. °· Being 35 years old or older. °· Being pregnant for the first time. °· Being pregnant with more than one baby. °· Becoming pregnant using fertility methods, such as IVF. °· Having other problems, such as diabetes, or kidney disease. °· Having family members who have high blood pressure. °What can I do to lower my risk? ° °· Keep a healthy weight. °· Eat a healthy diet. °· Follow what your doctor tells you about treating any medical problems that you had before becoming pregnant. °It is very important to go to all of your doctor visits. Your doctor will check your blood pressure and make sure that your pregnancy is progressing as it should. Treatment should start early if a problem is found. °How is this treated? °Treatment for high blood pressure during pregnancy can differ depending on the type of high blood pressure you have and how serious it is. °· You may need to take blood pressure medicine. °· If you have been taking medicine for your blood pressure, you may need to change the medicine during pregnancy if it is not safe for your baby. °· If your doctor thinks that you could get very high blood pressure, he or she may tell you to take a low-dose aspirin during your pregnancy. °· If you have very high blood pressure, you may need to stay in the hospital so you and your baby can be watched closely. You may also need to take medicine to lower your blood pressure. This medicine may be given by mouth   or through an IV tube.  In some cases, if your condition gets worse, you may need to have your baby early. Follow these instructions at home: Eating and drinking   Drink enough fluid to keep your pee (urine) pale yellow.  Avoid caffeine. Lifestyle  Do not use any products that contain nicotine or tobacco, such as cigarettes, e-cigarettes, and chewing tobacco. If you need help quitting, ask your doctor.  Do not use alcohol or drugs.  Avoid stress.  Rest and get plenty  of sleep.  Regular exercise can help. Ask your doctor what kinds of exercise are best for you. General instructions  Take over-the-counter and prescription medicines only as told by your doctor.  Keep all prenatal and follow-up visits as told by your doctor. This is important. Contact a doctor if:  You have symptoms that your doctor told you to watch for, such as: ? Headaches. ? Nausea. ? Vomiting. ? Belly (abdominal) pain. ? Dizziness. ? Light-headedness. Get help right away if:  You have: ? Very bad belly pain that does not get better with treatment. ? A very bad headache that does not get better. ? Vomiting that does not get better. ? Sudden, fast weight gain. ? Sudden swelling in your hands, ankles, or face. ? Bleeding from your vagina. ? Blood in your pee. ? Blurry vision. ? Double vision. ? Shortness of breath. ? Chest pain. ? Weakness on one side of your body. ? Trouble talking.  Your baby is not moving as much as usual. Summary  High blood pressure is also called hypertension.  High blood pressure means that the force of your blood moving in your body is too strong.  High blood pressure can cause problems for you and your baby.  Keep all follow-up visits as told by your doctor. This is important. This information is not intended to replace advice given to you by your health care provider. Make sure you discuss any questions you have with your health care provider. Document Revised: 12/30/2018 Document Reviewed: 10/05/2018 Elsevier Patient Education  [place].   I discontinued the Folic Acid 400mg  because the dose it too high.  It should be 400 MCG instead.

## 2020-04-11 NOTE — MAU Provider Note (Signed)
Chief Complaint:  Headache   First Provider Initiated Contact with Patient 04/11/20 0143     HPI: Donna Fowler is a 44 y.o. G1P0 at 52w3dho presents to maternity admissions reporting persistent headache unrelieved by Tylenol  Has has some elevated blood pressure on home machine.  Does not have a history of hypertension. . She reports good fetal movement, denies LOF, vaginal bleeding, vaginal itching/burning, urinary symptoms, h/a, dizziness, n/v, diarrhea, constipation or fever/chills.  She denies headache, visual changes or RUQ abdominal pain.  Headache  This is a recurrent problem. The current episode started today. The problem has been unchanged. The pain quality is similar to prior headaches. The quality of the pain is described as aching. Pertinent negatives include no abdominal pain, back pain, blurred vision, dizziness, fever, muscle aches, nausea, sinus pressure, vomiting or weakness. Nothing aggravates the symptoms. She has tried acetaminophen for the symptoms. The treatment provided mild relief.    RN Note: Patient reports HA since 1900 that will not improve with tylenol.  Bps around 137/83 and repeat was 139/90.  Denies hx of HTN.  Denies epigastric pain or visual disturbances.  Also reports increased swelling in her feet.    Past Medical History: Past Medical History:  Diagnosis Date  . Arthritis   . Carpal tunnel syndrome on both sides   . Endocervical polyp   . Gestational diabetes   . Infertility, female     Past obstetric history: OB History  Gravida Para Term Preterm AB Living  1            SAB TAB Ectopic Multiple Live Births               # Outcome Date GA Lbr Len/2nd Weight Sex Delivery Anes PTL Lv  1 Current             Past Surgical History: Past Surgical History:  Procedure Laterality Date  . COLONOSCOPY WITH ESOPHAGOGASTRODUODENOSCOPY (EGD)  2017  . DILATATION & CURETTAGE/HYSTEROSCOPY WITH MYOSURE N/A 06/18/2018   Procedure: DILATATION &  CURETTAGE/HYSTEROSCOPY WITH MYOSURE;  Surgeon: MLinda Hedges DO;  Location[place] [facility]  Service: Gynecology;  Laterality: N/A;  . ELBOW ARTHROSCOPY Left 2014  . LAPAROSCOPIC CHOLECYSTECTOMY  2009    Family History: Family History  Problem Relation Age of Onset  . Hypercholesterolemia Father   . Hypertension Father   . Heart disease Mother        CABG  . Diabetes Mother   . Heart disease Maternal Grandfather        CABG    Social History: Social History   Tobacco Use  . Smoking status: Never Smoker  . Smokeless tobacco: Never Used  Vaping Use  . Vaping Use: Never used  Substance Use Topics  . Alcohol use: Not Currently    Alcohol/week: 0.0 standard drinks    Comment: social  . Drug use: Never    Allergies: No Known Allergies  Meds:  Medications Prior to Admission  Medication Sig Dispense Refill Last Dose  . acetaminophen (TYLENOL) 650 MG CR tablet Take by mouth.   04/10/2020 at 1930  . albuterol (PROVENTIL HFA;VENTOLIN HFA) 108 (90 Base) MCG/ACT inhaler Inhale 2 puffs into the lungs every 6 (six) hours as needed for wheezing or shortness of breath. (Patient not taking: Reported on 02/13/2020) 1 Inhaler 0   . busPIRone (BUSPAR) 15 MG tablet Take 15 mg by mouth every evening.      . cetirizine (ZYRTEC) 10 MG tablet Take 10 mg  by mouth at bedtime.      . Cholecalciferol (VITAMIN D3) 1000 units CAPS Take 1 capsule by mouth daily.      . FERREX 150 150 MG capsule Take 150 mg by mouth daily.     . fluticasone (FLONASE) 50 MCG/ACT nasal spray Place 1 spray into the nose daily as needed for allergies or rhinitis.      . FOLIC ACID PO Take 400 mg by mouth daily.     . glucose blood test strip FreeStyle Lite Strips     . glucose monitoring kit (FREESTYLE) monitoring kit      . Lancets (FREESTYLE) lancets freestyle lancets     . Prenatal Vit-Fe Fumarate-FA (PRENATAL MULTIVITAMIN) TABS tablet Take 1 tablet by mouth daily at 12 noon.     . sertraline (ZOLOFT) 50 MG  tablet Take 50 mg by mouth at bedtime.      . sucralfate (CARAFATE) 1 GM/10ML suspension Take 10 mLs (1 g total) by mouth 3 (three) times daily. As needed 2700 mL 1     I have reviewed patient's Past Medical Hx, Surgical Hx, Family Hx, Social Hx, medications and allergies.   ROS:  Review of Systems  Constitutional: Negative for fever.  HENT: Negative for sinus pressure.   Eyes: Negative for blurred vision.  Gastrointestinal: Negative for abdominal pain, nausea and vomiting.  Musculoskeletal: Negative for back pain.  Neurological: Positive for headaches. Negative for dizziness and weakness.   Other systems negative  Physical Exam   Patient Vitals for the past 24 hrs:  BP Temp Pulse Resp Weight  04/11/20 0133 131/76 -- 94 -- --  04/11/20 0113 128/80 98.2 F (36.8 C) 96 20 100.3 kg   Vitals:   04/11/20 0316 04/11/20 0331 04/11/20 0346 04/11/20 0450  BP: 129/89 (!) 108/53 139/71 131/71  Pulse: 87 88 90 86  Resp:    19  Temp:      SpO2:      Weight:        Constitutional: Well-developed, well-nourished female in no acute distress.  Cardiovascular: normal rate and rhythm Respiratory: normal effort, clear to auscultation bilaterally GI: Abd soft, non-tender, gravid appropriate for gestational age.   No rebound or guarding. MS: Extremities nontender, no edema, normal ROM Neurologic: Alert and oriented x 4.  GU: Neg CVAT.  PELVIC EXAM:   Closed/40%/Balottable  FHT:  Baseline 135 , moderate variability, accelerations present, no decelerations Contractions: q 3-5 mins Irregular     Labs: Results for orders placed or performed during the hospital encounter of 04/11/20 (from the past 24 hour(s))  Urinalysis, Routine w reflex microscopic     Status: Abnormal   Collection Time: 04/11/20  1:30 AM  Result Value Ref Range   Color, Urine STRAW (A) YELLOW   APPearance CLEAR CLEAR   Specific Gravity, Urine 1.006 1.005 - 1.030   pH 6.0 5.0 - 8.0   Glucose, UA NEGATIVE NEGATIVE  mg/dL   Hgb urine dipstick NEGATIVE NEGATIVE   Bilirubin Urine NEGATIVE NEGATIVE   Ketones, ur NEGATIVE NEGATIVE mg/dL   Protein, ur NEGATIVE NEGATIVE mg/dL   Nitrite NEGATIVE NEGATIVE   Leukocytes,Ua NEGATIVE NEGATIVE  Protein / creatinine ratio, urine     Status: None   Collection Time: 04/11/20  1:45 AM  Result Value Ref Range   Creatinine, Urine 26.34 mg/dL   Total Protein, Urine <6 mg/dL   Protein Creatinine Ratio        0.00 - 0.15 mg/mg[Cre]  CBC       Status: Abnormal   Collection Time: 04/11/20  2:00 AM  Result Value Ref Range   WBC 9.0 4.0 - 10.5 K/uL   RBC 4.37 3.87 - 5.11 MIL/uL   Hemoglobin 12.2 12.0 - 15.0 g/dL   HCT 36.6 36 - 46 %   MCV 83.8 80.0 - 100.0 fL   MCH 27.9 26.0 - 34.0 pg   MCHC 33.3 30.0 - 36.0 g/dL   RDW 16.2 (H) 11.5 - 15.5 %   Platelets 360 150 - 400 K/uL   nRBC 0.0 0.0 - 0.2 %  Comprehensive metabolic panel     Status: Abnormal   Collection Time: 04/11/20  2:00 AM  Result Value Ref Range   Sodium 134 (L) 135 - 145 mmol/L   Potassium 4.0 3.5 - 5.1 mmol/L   Chloride 106 98 - 111 mmol/L   CO2 16 (L) 22 - 32 mmol/L   Glucose, Bld 137 (H) 70 - 99 mg/dL   BUN 8 6 - 20 mg/dL   Creatinine, Ser 0.65 0.44 - 1.00 mg/dL   Calcium 9.1 8.9 - 10.3 mg/dL   Total Protein 6.1 (L) 6.5 - 8.1 g/dL   Albumin 2.6 (L) 3.5 - 5.0 g/dL   AST 19 15 - 41 U/L   ALT 16 0 - 44 U/L   Alkaline Phosphatase 92 38 - 126 U/L   Total Bilirubin 0.6 0.3 - 1.2 mg/dL   GFR calc non Af Amer >60 >60 mL/min   GFR calc Af Amer >60 >60 mL/min   Anion gap 12 5 - 15    Imaging:  No results found.  MAU Course/MDM: I have ordered labs and reviewed results. Labs are within normal limits.  Blood pressures were all normal except for one single BP of 129/89.  Others 120s/70s. NST reviewed, reactive with irregular contractions We gave two dose of procardia which did slow contractions She developed a headache which she stated was similar to her migraines in past We gave her Reglan and  Benadryl which did improve the headache.   She had one variable decel to the 90s lasting about 50 seconds.  We kept her on the monitor for another hour and there were no further decels.  FHR tracing was reassuring thereafter.   Assessment: Single IUP at 51w3dIntermittent hypertension, normal labs Preterm uterine contractions Headache, migraine vs side effect from procardia  Plan: Discharge home Follow BPs in office, may want to check calibration of home machine.  Preterm Labor precautions and fetal kick counts Follow up in Office for prenatal visits  Encouraged to return here or to other Urgent Care/ED if she develops worsening of symptoms, increase in pain, fever, or other concerning symptoms.   Pt stable at time of discharge.  MHansel FeinsteinCNM, MSN Certified Nurse-Midwife 04/11/2020 1:43 AM

## 2020-04-18 ENCOUNTER — Encounter (HOSPITAL_COMMUNITY): Payer: Self-pay | Admitting: *Deleted

## 2020-04-18 ENCOUNTER — Telehealth (HOSPITAL_COMMUNITY): Payer: Self-pay | Admitting: *Deleted

## 2020-04-18 NOTE — Telephone Encounter (Signed)
Preadmission screen  

## 2020-04-19 ENCOUNTER — Encounter (HOSPITAL_COMMUNITY): Payer: Self-pay | Admitting: *Deleted

## 2020-04-25 ENCOUNTER — Other Ambulatory Visit: Payer: Self-pay

## 2020-04-25 ENCOUNTER — Encounter (HOSPITAL_COMMUNITY): Payer: Self-pay | Admitting: Obstetrics & Gynecology

## 2020-04-25 ENCOUNTER — Inpatient Hospital Stay (HOSPITAL_COMMUNITY)
Admission: AD | Admit: 2020-04-25 | Discharge: 2020-04-29 | DRG: 788 | Disposition: A | Discharge status: Home / Self Care | Payer: No Typology Code available for payment source | Attending: Obstetrics and Gynecology | Admitting: Obstetrics and Gynecology

## 2020-04-25 DIAGNOSIS — O99891 Other specified diseases and conditions complicating pregnancy: Secondary | ICD-10-CM | POA: Diagnosis not present

## 2020-04-25 DIAGNOSIS — R030 Elevated blood-pressure reading, without diagnosis of hypertension: Secondary | ICD-10-CM

## 2020-04-25 DIAGNOSIS — O24425 Gestational diabetes mellitus in childbirth, controlled by oral hypoglycemic drugs: Secondary | ICD-10-CM | POA: Diagnosis present

## 2020-04-25 DIAGNOSIS — Z3A38 38 weeks gestation of pregnancy: Secondary | ICD-10-CM | POA: Diagnosis not present

## 2020-04-25 DIAGNOSIS — O134 Gestational [pregnancy-induced] hypertension without significant proteinuria, complicating childbirth: Secondary | ICD-10-CM | POA: Diagnosis not present

## 2020-04-25 DIAGNOSIS — O3663X0 Maternal care for excessive fetal growth, third trimester, not applicable or unspecified: Secondary | ICD-10-CM | POA: Diagnosis present

## 2020-04-25 DIAGNOSIS — Z3689 Encounter for other specified antenatal screening: Secondary | ICD-10-CM

## 2020-04-25 DIAGNOSIS — O99824 Streptococcus B carrier state complicating childbirth: Secondary | ICD-10-CM | POA: Diagnosis present

## 2020-04-25 DIAGNOSIS — Z20822 Contact with and (suspected) exposure to covid-19: Secondary | ICD-10-CM | POA: Diagnosis present

## 2020-04-25 HISTORY — DX: Gestational (pregnancy-induced) hypertension without significant proteinuria, unspecified trimester: O13.9

## 2020-04-25 NOTE — MAU Note (Signed)
Had office appt yesterday and had sve. 2/5cm and vtx. Tonight had some mucousy d/c and abd cramping. GDM on med and gest HTN. For IOL this MOnday am

## 2020-04-26 ENCOUNTER — Encounter (HOSPITAL_COMMUNITY)
Admission: AD | Disposition: A | Discharge status: Home / Self Care | Payer: Self-pay | Source: Home / Self Care | Attending: Obstetrics and Gynecology

## 2020-04-26 ENCOUNTER — Other Ambulatory Visit: Payer: Self-pay

## 2020-04-26 ENCOUNTER — Encounter (HOSPITAL_COMMUNITY): Payer: Self-pay | Admitting: Anesthesiology

## 2020-04-26 ENCOUNTER — Encounter (HOSPITAL_COMMUNITY): Payer: Self-pay | Admitting: Obstetrics & Gynecology

## 2020-04-26 ENCOUNTER — Inpatient Hospital Stay (HOSPITAL_COMMUNITY): Payer: No Typology Code available for payment source | Admitting: Anesthesiology

## 2020-04-26 DIAGNOSIS — O3663X0 Maternal care for excessive fetal growth, third trimester, not applicable or unspecified: Secondary | ICD-10-CM | POA: Diagnosis present

## 2020-04-26 DIAGNOSIS — R030 Elevated blood-pressure reading, without diagnosis of hypertension: Secondary | ICD-10-CM | POA: Diagnosis not present

## 2020-04-26 DIAGNOSIS — Z3A38 38 weeks gestation of pregnancy: Secondary | ICD-10-CM

## 2020-04-26 DIAGNOSIS — O99824 Streptococcus B carrier state complicating childbirth: Secondary | ICD-10-CM | POA: Diagnosis present

## 2020-04-26 DIAGNOSIS — O134 Gestational [pregnancy-induced] hypertension without significant proteinuria, complicating childbirth: Secondary | ICD-10-CM | POA: Diagnosis present

## 2020-04-26 DIAGNOSIS — O99891 Other specified diseases and conditions complicating pregnancy: Secondary | ICD-10-CM | POA: Diagnosis not present

## 2020-04-26 DIAGNOSIS — O24425 Gestational diabetes mellitus in childbirth, controlled by oral hypoglycemic drugs: Secondary | ICD-10-CM | POA: Diagnosis present

## 2020-04-26 DIAGNOSIS — Z20822 Contact with and (suspected) exposure to covid-19: Secondary | ICD-10-CM | POA: Diagnosis present

## 2020-04-26 LAB — COMPREHENSIVE METABOLIC PANEL
ALT: 14 U/L (ref 0–44)
AST: 16 U/L (ref 15–41)
Albumin: 2.5 g/dL — ABNORMAL LOW (ref 3.5–5.0)
Alkaline Phosphatase: 108 U/L (ref 38–126)
Anion gap: 9 (ref 5–15)
BUN: 8 mg/dL (ref 6–20)
CO2: 19 mmol/L — ABNORMAL LOW (ref 22–32)
Calcium: 9.4 mg/dL (ref 8.9–10.3)
Chloride: 105 mmol/L (ref 98–111)
Creatinine, Ser: 0.62 mg/dL (ref 0.44–1.00)
GFR calc Af Amer: >60 mL/min (ref >60)
GFR calc non Af Amer: >60 mL/min (ref >60)
Glucose, Bld: 116 mg/dL — ABNORMAL HIGH (ref 70–99)
Potassium: 4.4 mmol/L (ref 3.5–5.1)
Sodium: 133 mmol/L — ABNORMAL LOW (ref 135–145)
Total Bilirubin: 0.7 mg/dL (ref 0.3–1.2)
Total Protein: 6.1 g/dL — ABNORMAL LOW (ref 6.5–8.1)

## 2020-04-26 LAB — GLUCOSE, CAPILLARY
Glucose-Capillary: 93 mg/dL (ref 70–99)
Glucose-Capillary: 115 mg/dL — ABNORMAL HIGH (ref 70–99)
Glucose-Capillary: 87 mg/dL (ref 70–99)
Glucose-Capillary: 72 mg/dL (ref 70–99)
Glucose-Capillary: 81 mg/dL (ref 70–99)
Glucose-Capillary: 83 mg/dL (ref 70–99)
Glucose-Capillary: 72 mg/dL (ref 70–99)
Glucose-Capillary: 87 mg/dL (ref 70–99)
Glucose-Capillary: 101 mg/dL — ABNORMAL HIGH (ref 70–99)
Glucose-Capillary: 104 mg/dL — ABNORMAL HIGH (ref 70–99)
Glucose-Capillary: 120 mg/dL — ABNORMAL HIGH (ref 70–99)

## 2020-04-26 LAB — CBC
HCT: 36 % (ref 36.0–46.0)
HCT: 37.2 % (ref 36.0–46.0)
Hemoglobin: 12.1 g/dL (ref 12.0–15.0)
Hemoglobin: 12.4 g/dL (ref 12.0–15.0)
MCH: 28.4 pg (ref 26.0–34.0)
MCH: 28.5 pg (ref 26.0–34.0)
MCHC: 33.3 g/dL (ref 30.0–36.0)
MCHC: 33.6 g/dL (ref 30.0–36.0)
MCV: 84.7 fL (ref 80.0–100.0)
MCV: 85.1 fL (ref 80.0–100.0)
Platelets: 375 10*3/uL (ref 150–400)
Platelets: 383 10*3/uL (ref 150–400)
RBC: 4.25 MIL/uL (ref 3.87–5.11)
RBC: 4.37 MIL/uL (ref 3.87–5.11)
RDW: 16.3 % — ABNORMAL HIGH (ref 11.5–15.5)
RDW: 16.4 % — ABNORMAL HIGH (ref 11.5–15.5)
WBC: 10.5 10*3/uL (ref 4.0–10.5)
WBC: 9.5 10*3/uL (ref 4.0–10.5)
nRBC: 0 % (ref 0.0–0.2)
nRBC: 0 % (ref 0.0–0.2)

## 2020-04-26 LAB — TYPE AND SCREEN
ABO/RH(D): A POS
Antibody Screen: NEGATIVE

## 2020-04-26 LAB — PROTEIN / CREATININE RATIO, URINE
Creatinine, Urine: 65.39 mg/dL
Protein Creatinine Ratio: 0.15 mg/mg{Cre} (ref 0.00–0.15)
Total Protein, Urine: 10 mg/dL

## 2020-04-26 LAB — RPR: RPR Ser Ql: NONREACTIVE

## 2020-04-26 LAB — SARS CORONAVIRUS 2 BY RT PCR (HOSPITAL ORDER, PERFORMED IN ~~LOC~~ HOSPITAL LAB): SARS Coronavirus 2: NEGATIVE

## 2020-04-26 SURGERY — Surgical Case
Anesthesia: Epidural | Site: Abdomen | Wound class: Clean Contaminated

## 2020-04-26 MED ORDER — PHENYLEPHRINE HCL (PRESSORS) 10 MG/ML IV SOLN
INTRAVENOUS | Status: DC | PRN
  Administered 2020-04-26 (×3): 80 ug via INTRAVENOUS

## 2020-04-26 MED ORDER — LIDOCAINE-EPINEPHRINE (PF) 2 %-1:200000 IJ SOLN
INTRAMUSCULAR | Status: DC | PRN
  Administered 2020-04-26: 3 mL via EPIDURAL
  Administered 2020-04-26: 5 mL via EPIDURAL

## 2020-04-26 MED ORDER — LIDOCAINE HCL (PF) 1 % IJ SOLN
INTRAMUSCULAR | Status: DC | PRN
  Administered 2020-04-26 (×2): 4 mL via EPIDURAL

## 2020-04-26 MED ORDER — DIPHENHYDRAMINE HCL 25 MG PO CAPS: 25.0000 mg | ORAL_CAPSULE | ORAL | Status: DC | PRN

## 2020-04-26 MED ORDER — ACETAMINOPHEN 325 MG PO TABS: 650.0000 mg | ORAL_TABLET | ORAL | Status: DC | PRN

## 2020-04-26 MED ORDER — LIDOCAINE HCL (PF) 1 % IJ SOLN: 30.0000 mL | INTRAMUSCULAR | Status: DC | PRN

## 2020-04-26 MED ORDER — LACTATED RINGERS IV SOLN: INTRAVENOUS | Status: DC | PRN

## 2020-04-26 MED ORDER — ONDANSETRON HCL 4 MG/2ML IJ SOLN: 4.0000 mg | Freq: Once | INTRAMUSCULAR | Status: DC | PRN

## 2020-04-26 MED ORDER — LACTATED RINGERS IV SOLN: 500.0000 mL | INTRAVENOUS | Status: DC | PRN

## 2020-04-26 MED ORDER — ONDANSETRON HCL 4 MG/2ML IJ SOLN
INTRAMUSCULAR | Status: DC | PRN
  Administered 2020-04-26: 4 mg via INTRAVENOUS

## 2020-04-26 MED ORDER — OXYTOCIN-SODIUM CHLORIDE 30-0.9 UT/500ML-% IV SOLN: 2.5000 [IU]/h | INTRAVENOUS | Status: DC

## 2020-04-26 MED ORDER — EPHEDRINE 5 MG/ML INJ: 10.0000 mg | INTRAVENOUS | Status: DC | PRN

## 2020-04-26 MED ORDER — NALBUPHINE HCL 10 MG/ML IJ SOLN: 5.0000 mg | Freq: Once | INTRAMUSCULAR | Status: DC | PRN

## 2020-04-26 MED ORDER — ONDANSETRON HCL 4 MG/2ML IJ SOLN: 4.0000 mg | Freq: Four times a day (QID) | INTRAMUSCULAR | Status: DC | PRN

## 2020-04-26 MED ORDER — KETOROLAC TROMETHAMINE 30 MG/ML IJ SOLN: 30.0000 mg | Freq: Once | INTRAMUSCULAR | Status: AC | PRN

## 2020-04-26 MED ORDER — STERILE WATER FOR IRRIGATION IR SOLN
Status: DC | PRN
  Administered 2020-04-26: 1000 mL

## 2020-04-26 MED ORDER — SOD CITRATE-CITRIC ACID 500-334 MG/5ML PO SOLN
30.0000 mL | ORAL | Status: DC | PRN
  Administered 2020-04-26: 30 mL via ORAL
  Filled 2020-04-26 (×2): qty 30

## 2020-04-26 MED ORDER — OXYTOCIN-SODIUM CHLORIDE 30-0.9 UT/500ML-% IV SOLN
INTRAVENOUS | Status: AC
  Filled 2020-04-26: qty 500

## 2020-04-26 MED ORDER — SODIUM CHLORIDE 0.9 % IV SOLN
5.0000 10*6.[IU] | Freq: Once | INTRAVENOUS | Status: AC
  Administered 2020-04-26: 5 10*6.[IU] via INTRAVENOUS
  Filled 2020-04-26: qty 5

## 2020-04-26 MED ORDER — PHENYLEPHRINE 40 MCG/ML (10ML) SYRINGE FOR IV PUSH (FOR BLOOD PRESSURE SUPPORT): 80.0000 ug | PREFILLED_SYRINGE | INTRAVENOUS | Status: DC | PRN

## 2020-04-26 MED ORDER — CEFAZOLIN SODIUM-DEXTROSE 2-4 GM/100ML-% IV SOLN
INTRAVENOUS | Status: AC
  Filled 2020-04-26: qty 100

## 2020-04-26 MED ORDER — OXYTOCIN-SODIUM CHLORIDE 30-0.9 UT/500ML-% IV SOLN
1.0000 m[IU]/min | INTRAVENOUS | Status: DC
  Administered 2020-04-26: 2 m[IU]/min via INTRAVENOUS
  Filled 2020-04-26: qty 500

## 2020-04-26 MED ORDER — OXYTOCIN BOLUS FROM INFUSION: 333.0000 mL | Freq: Once | INTRAVENOUS | Status: DC

## 2020-04-26 MED ORDER — DIPHENHYDRAMINE HCL 50 MG/ML IJ SOLN: 12.5000 mg | INTRAMUSCULAR | Status: DC | PRN

## 2020-04-26 MED ORDER — MORPHINE SULFATE (PF) 0.5 MG/ML IJ SOLN
INTRAMUSCULAR | Status: AC
  Filled 2020-04-26: qty 10

## 2020-04-26 MED ORDER — NALOXONE HCL 0.4 MG/ML IJ SOLN: 0.4000 mg | INTRAMUSCULAR | Status: DC | PRN

## 2020-04-26 MED ORDER — CEFAZOLIN SODIUM-DEXTROSE 2-4 GM/100ML-% IV SOLN
2.0000 g | INTRAVENOUS | Status: AC
  Administered 2020-04-26: 2 g via INTRAVENOUS
  Filled 2020-04-26 (×2): qty 100

## 2020-04-26 MED ORDER — HYDROMORPHONE HCL 1 MG/ML IJ SOLN: 0.2500 mg | INTRAMUSCULAR | Status: DC | PRN

## 2020-04-26 MED ORDER — FENTANYL-BUPIVACAINE-NACL 0.5-0.125-0.9 MG/250ML-% EP SOLN
12.0000 mL/h | EPIDURAL | Status: DC | PRN
  Filled 2020-04-26: qty 250

## 2020-04-26 MED ORDER — SODIUM CHLORIDE (PF) 0.9 % IJ SOLN
INTRAMUSCULAR | Status: DC | PRN
  Administered 2020-04-26: 12 mL/h via EPIDURAL

## 2020-04-26 MED ORDER — OXYCODONE-ACETAMINOPHEN 5-325 MG PO TABS: 2.0000 | ORAL_TABLET | ORAL | Status: DC | PRN

## 2020-04-26 MED ORDER — SODIUM CHLORIDE 0.9% FLUSH: 3.0000 mL | INTRAVENOUS | Status: DC | PRN

## 2020-04-26 MED ORDER — FENTANYL CITRATE (PF) 100 MCG/2ML IJ SOLN
INTRAMUSCULAR | Status: AC
  Filled 2020-04-26: qty 2

## 2020-04-26 MED ORDER — SODIUM CHLORIDE 0.9 % IR SOLN
Status: DC | PRN
  Administered 2020-04-26: 1000 mL

## 2020-04-26 MED ORDER — OXYTOCIN-SODIUM CHLORIDE 30-0.9 UT/500ML-% IV SOLN
INTRAVENOUS | Status: DC | PRN
Start: 2020-04-26 — End: 2020-04-26
  Administered 2020-04-26: 200 mL via INTRAVENOUS
  Administered 2020-04-26: 500 mL via INTRAVENOUS

## 2020-04-26 MED ORDER — LACTATED RINGERS IV SOLN: 500.0000 mL | Freq: Once | INTRAVENOUS | Status: DC

## 2020-04-26 MED ORDER — FENTANYL CITRATE (PF) 100 MCG/2ML IJ SOLN
INTRAMUSCULAR | Status: DC | PRN
  Administered 2020-04-26: 100 ug via EPIDURAL

## 2020-04-26 MED ORDER — ONDANSETRON HCL 4 MG/2ML IJ SOLN
INTRAMUSCULAR | Status: AC
  Filled 2020-04-26: qty 2

## 2020-04-26 MED ORDER — MORPHINE SULFATE (PF) 0.5 MG/ML IJ SOLN
INTRAMUSCULAR | Status: DC | PRN
  Administered 2020-04-26: 3 mg via EPIDURAL

## 2020-04-26 MED ORDER — NALBUPHINE HCL 10 MG/ML IJ SOLN: 5.0000 mg | INTRAMUSCULAR | Status: DC | PRN

## 2020-04-26 MED ORDER — SOD CITRATE-CITRIC ACID 500-334 MG/5ML PO SOLN
30.0000 mL | ORAL | Status: AC
  Administered 2020-04-26: 30 mL via ORAL

## 2020-04-26 MED ORDER — OXYCODONE HCL 5 MG PO TABS: 5.0000 mg | ORAL_TABLET | Freq: Once | ORAL | Status: DC | PRN

## 2020-04-26 MED ORDER — MEPERIDINE HCL 25 MG/ML IJ SOLN
INTRAMUSCULAR | Status: AC
  Filled 2020-04-26: qty 1

## 2020-04-26 MED ORDER — TERBUTALINE SULFATE 1 MG/ML IJ SOLN: 0.2500 mg | Freq: Once | INTRAMUSCULAR | Status: DC | PRN

## 2020-04-26 MED ORDER — LACTATED RINGERS IV SOLN: INTRAVENOUS | Status: DC

## 2020-04-26 MED ORDER — FLEET ENEMA 7-19 GM/118ML RE ENEM: 1.0000 | ENEMA | RECTAL | Status: DC | PRN

## 2020-04-26 MED ORDER — PHENYLEPHRINE 40 MCG/ML (10ML) SYRINGE FOR IV PUSH (FOR BLOOD PRESSURE SUPPORT)
PREFILLED_SYRINGE | INTRAVENOUS | Status: AC
  Filled 2020-04-26: qty 10

## 2020-04-26 MED ORDER — SCOPOLAMINE 1 MG/3DAYS TD PT72: 1.0000 | MEDICATED_PATCH | Freq: Once | TRANSDERMAL | Status: DC

## 2020-04-26 MED ORDER — ONDANSETRON HCL 4 MG/2ML IJ SOLN
4.0000 mg | Freq: Three times a day (TID) | INTRAMUSCULAR | Status: DC | PRN
  Administered 2020-04-27: 4 mg via INTRAVENOUS
  Filled 2020-04-26: qty 2

## 2020-04-26 MED ORDER — PENICILLIN G POT IN DEXTROSE 60000 UNIT/ML IV SOLN
3.0000 10*6.[IU] | INTRAVENOUS | Status: DC
  Administered 2020-04-26 (×4): 3 10*6.[IU] via INTRAVENOUS
  Filled 2020-04-26 (×4): qty 50

## 2020-04-26 MED ORDER — OXYCODONE-ACETAMINOPHEN 5-325 MG PO TABS: 1.0000 | ORAL_TABLET | ORAL | Status: DC | PRN

## 2020-04-26 MED ORDER — OXYCODONE HCL 5 MG/5ML PO SOLN: 5.0000 mg | Freq: Once | ORAL | Status: DC | PRN

## 2020-04-26 MED ORDER — MEPERIDINE HCL 25 MG/ML IJ SOLN
INTRAMUSCULAR | Status: DC | PRN
  Administered 2020-04-26 (×2): 12.5 mg via INTRAVENOUS

## 2020-04-26 MED ORDER — NALOXONE HCL 4 MG/10ML IJ SOLN: 1.0000 ug/kg/h | INTRAVENOUS | Status: DC | PRN

## 2020-04-26 SURGICAL SUPPLY — 37 items
BENZOIN TINCTURE PRP APPL 2/3 (GAUZE/BANDAGES/DRESSINGS) ×2 IMPLANT
CHLORAPREP W/TINT 26ML (MISCELLANEOUS) ×2 IMPLANT
CLAMP CORD UMBIL (MISCELLANEOUS) IMPLANT
CLOTH BEACON ORANGE TIMEOUT ST (SAFETY) ×2 IMPLANT
CLSR STERI-STRIP ANTIMIC 1/2X4 (GAUZE/BANDAGES/DRESSINGS) ×2 IMPLANT
DRSG OPSITE POSTOP 4X10 (GAUZE/BANDAGES/DRESSINGS) ×2 IMPLANT
ELECT REM PT RETURN 9FT ADLT (ELECTROSURGICAL) ×2
ELECTRODE REM PT RTRN 9FT ADLT (ELECTROSURGICAL) ×1 IMPLANT
EXTRACTOR VACUUM KIWI (MISCELLANEOUS) IMPLANT
GLOVE BIO SURGEON STRL SZ 6.5 (GLOVE) ×2 IMPLANT
GLOVE BIOGEL PI IND STRL 6.5 (GLOVE) ×1 IMPLANT
GLOVE BIOGEL PI IND STRL 7.0 (GLOVE) ×2 IMPLANT
GLOVE BIOGEL PI INDICATOR 6.5 (GLOVE) ×1
GLOVE BIOGEL PI INDICATOR 7.0 (GLOVE) ×2
GOWN STRL REUS W/TWL LRG LVL3 (GOWN DISPOSABLE) ×4 IMPLANT
KIT ABG SYR 3ML LUER SLIP (SYRINGE) ×2 IMPLANT
NEEDLE HYPO 25X5/8 SAFETYGLIDE (NEEDLE) ×2 IMPLANT
NS IRRIG 1000ML POUR BTL (IV SOLUTION) ×2 IMPLANT
PACK C SECTION WH (CUSTOM PROCEDURE TRAY) ×2 IMPLANT
PAD ABD 8X7 1/2 STERILE (GAUZE/BANDAGES/DRESSINGS) ×2 IMPLANT
PAD OB MATERNITY 4.3X12.25 (PERSONAL CARE ITEMS) ×2 IMPLANT
PENCIL SMOKE EVAC W/HOLSTER (ELECTROSURGICAL) ×2 IMPLANT
SPONGE GAUZE 4X4 12PLY STER LF (GAUZE/BANDAGES/DRESSINGS) ×4 IMPLANT
STRIP CLOSURE SKIN 1/2X4 (GAUZE/BANDAGES/DRESSINGS) ×2 IMPLANT
SUT PLAIN 0 NONE (SUTURE) IMPLANT
SUT PLAIN 2 0 (SUTURE) ×1
SUT PLAIN ABS 2-0 CT1 27XMFL (SUTURE) ×1 IMPLANT
SUT VIC AB 0 CT1 36 (SUTURE) ×2 IMPLANT
SUT VIC AB 0 CTX 36 (SUTURE) ×2
SUT VIC AB 0 CTX36XBRD ANBCTRL (SUTURE) ×2 IMPLANT
SUT VIC AB 3-0 SH 27 (SUTURE) ×1
SUT VIC AB 3-0 SH 27X BRD (SUTURE) ×1 IMPLANT
SUT VIC AB 4-0 PS2 27 (SUTURE) ×2 IMPLANT
TAPE CLOTH SURG 4X10 WHT LF (GAUZE/BANDAGES/DRESSINGS) ×2 IMPLANT
TOWEL OR 17X24 6PK STRL BLUE (TOWEL DISPOSABLE) ×2 IMPLANT
TRAY FOLEY W/BAG SLVR 14FR LF (SET/KITS/TRAYS/PACK) IMPLANT
WATER STERILE IRR 1000ML POUR (IV SOLUTION) ×2 IMPLANT

## 2020-04-26 NOTE — Progress Notes (Signed)
SVE 7/c/-2, DOP IUPC placed TOco inadequate Continue pitocin titration Peanut ball to facilitate head rotation. Manual rotation unsuccessful.    Arty Baumgartner MD

## 2020-04-26 NOTE — Progress Notes (Signed)
Still awaiting an open operating room

## 2020-04-26 NOTE — Transfer of Care (Signed)
Immediate Anesthesia Transfer of Care Note  Patient: Donna Fowler  Procedure(s) Performed: CESAREAN SECTION (N/A Abdomen)  Patient Location: PACU  Anesthesia Type:Epidural  Level of Consciousness: awake, alert , oriented and patient cooperative  Airway & Oxygen Therapy: Patient Spontanous Breathing  Post-op Assessment: Report given to RN and Post -op Vital signs reviewed and stable  Post vital signs: Reviewed and stable  Last Vitals:  Vitals Value Taken Time  BP    Temp    Pulse    Resp    SpO2      Last Pain:  Vitals:   04/26/20 2002  TempSrc: Oral  PainSc:       Patients Stated Pain Goal: 0 (56/70/14 1030)  Complications: No complications documented.

## 2020-04-26 NOTE — MAU Note (Signed)
Covid swab obtained without difficulty and pt tol well. No symptoms 

## 2020-04-26 NOTE — Anesthesia Procedure Notes (Signed)
Epidural Patient location during procedure: OB Start time: 04/26/2020 9:01 AM End time: 04/26/2020 9:04 AM  Staffing Anesthesiologist: Brennan Bailey, MD Performed: anesthesiologist   Preanesthetic Checklist Completed: patient identified, IV checked, risks and benefits discussed, monitors and equipment checked, pre-op evaluation and timeout performed  Epidural Patient position: sitting Prep: DuraPrep and site prepped and draped Patient monitoring: continuous pulse ox, blood pressure and heart rate Approach: midline Location: L3-L4 Injection technique: LOR air  Needle:  Needle type: Tuohy  Needle gauge: 17 G Needle length: 9 cm Needle insertion depth: 6 cm Catheter type: closed end flexible Catheter size: 19 Gauge Catheter at skin depth: 11 cm Test dose: negative and Other (1% lidocaine)  Assessment Events: blood not aspirated, injection not painful, no injection resistance, no paresthesia and negative IV test  Additional Notes Patient identified. Risks, benefits, and alternatives discussed with patient including but not limited to bleeding, infection, nerve damage, paralysis, failed block, incomplete pain control, headache, blood pressure changes, nausea, vomiting, reactions to medication, itching, and postpartum back pain. Confirmed with bedside nurse the patient's most recent platelet count. Confirmed with patient that they are not currently taking any anticoagulation, have any bleeding history, or any family history of bleeding disorders. Patient expressed understanding and wished to proceed. All questions were answered. Sterile technique was used throughout the entire procedure. Please see nursing notes for vital signs.   Crisp LOR on first pass. Test dose was given through epidural catheter and negative prior to continuing to dose epidural or start infusion. Warning signs of high block given to the patient including shortness of breath, tingling/numbness in hands, complete  motor block, or any concerning symptoms with instructions to call for help. Patient was given instructions on fall risk and not to get out of bed. All questions and concerns addressed with instructions to call with any issues or inadequate analgesia.  Reason for block:procedure for pain

## 2020-04-26 NOTE — Anesthesia Preprocedure Evaluation (Signed)
Anesthesia Evaluation  Patient identified by MRN, date of birth, ID band Patient awake    Reviewed: Allergy & Precautions, Patient's Chart, lab work & pertinent test results  History of Anesthesia Complications Negative for: history of anesthetic complications  Airway Mallampati: II  TM Distance: >3 FB Neck ROM: Full    Dental no notable dental hx.    Pulmonary neg pulmonary ROS,    Pulmonary exam normal        Cardiovascular hypertension (gestational), Normal cardiovascular exam     Neuro/Psych Anxiety Depression negative neurological ROS     GI/Hepatic negative GI ROS, Neg liver ROS,   Endo/Other  diabetes, Gestational, Oral Hypoglycemic Agents  Renal/GU negative Renal ROS  negative genitourinary   Musculoskeletal  (+) Arthritis ,   Abdominal   Peds  Hematology negative hematology ROS (+)   Anesthesia Other Findings Day of surgery medications reviewed with patient.  Reproductive/Obstetrics (+) Pregnancy                             Anesthesia Physical Anesthesia Plan  ASA: II  Anesthesia Plan: Epidural   Post-op Pain Management:    Induction:   PONV Risk Score and Plan: Treatment may vary due to age or medical condition  Airway Management Planned: Natural Airway  Additional Equipment:   Intra-op Plan:   Post-operative Plan:   Informed Consent: I have reviewed the patients History and Physical, chart, labs and discussed the procedure including the risks, benefits and alternatives for the proposed anesthesia with the patient or authorized representative who has indicated his/her understanding and acceptance.       Plan Discussed with:   Anesthesia Plan Comments:         Anesthesia Quick Evaluation

## 2020-04-26 NOTE — Progress Notes (Signed)
SVE remains unchanged. Recommend primary cesarean section for arrest of dilation in active labor. Risks discussed including infection, bleeding, damage to surrounding structures, the need for additional procedures including hysterectomy, and the possibility of uterine rupture with neonatal morbidity/mortality, scarring, and abnormal placentation with subsequent pregnancies. Patient agrees to proceed. 2g ancef on call to OR.    Arty Baumgartner MD

## 2020-04-26 NOTE — H&P (Signed)
Donna Fowler is a 44 y.o. female presenting for early labor and gHTN. Cervical change in MAU from 3 to 4.5cm but then stalled and contractions stopped. She had mild range Bps with normal labs. She has been completely asymptomatic and not requiring medications or any treatment. She was admitted overnight and started on pitocin when labor stalled. This morning she reports doing well but painfully contracting on pitocin.  Pregnancy c/b multiple issues. 1. A2GDM: glyburide. Good control 2. LGA: 96%ile at 33 wga --> 76%ile at 36 wga 6#15 3. GBS positive 4. AMA - panorama RR female. ASA 5. IVF - FET. ECHO WNL.  6. Anxiety - no meds 7. Asthma - no meds.  OB History    Gravida  2   Para      Term      Preterm      AB  1   Living        SAB  1   TAB      Ectopic      Multiple      Live Births             Past Medical History:  Diagnosis Date  . Anxiety   . Arthritis   . Carpal tunnel syndrome on both sides   . Depression   . Endocervical polyp   . Gestational diabetes   . Gestational hypertension   . Headache   . Infertility, female   . Newborn product of in vitro fertilization (IVF) pregnancy    Past Surgical History:  Procedure Laterality Date  . COLONOSCOPY WITH ESOPHAGOGASTRODUODENOSCOPY (EGD)  2017  . DILATATION & CURETTAGE/HYSTEROSCOPY WITH MYOSURE N/A 06/18/2018   Procedure: DILATATION & CURETTAGE/HYSTEROSCOPY WITH MYOSURE;  Surgeon: Linda Hedges, DO;  Location: [facility];  Service: Gynecology;  Laterality: N/A;  . ELBOW ARTHROSCOPY Left 2014  . excision of lymph node     axillary   . LAPAROSCOPIC CHOLECYSTECTOMY  2009  . PILONIDAL CYST EXCISION     Family History: family history includes Diabetes in her mother; Heart disease in her maternal grandfather and mother; Hypercholesterolemia in her father; Hypertension in her father. Social History:  reports that she has never smoked. She has never used smokeless tobacco. She reports previous  alcohol use. She reports that she does not use drugs.     Maternal Diabetes: Yes:  Diabetes Type:  Insulin/Medication controlled Genetic Screening: Normal Maternal Ultrasounds/Referrals: Normal Fetal Ultrasounds or other Referrals:  None Maternal Substance Abuse:  No Significant Maternal Medications:  None Significant Maternal Lab Results:  Group B Strep positive Other Comments:  None  Review of Systems History Dilation: 5 Effacement (%): 60 Station: -2 Exam by:: Ola Spurr, RN Blood pressure (!) 139/94, pulse 87, temperature 98.2 F (36.8 C), temperature source Oral, resp. rate 18, height 5\' 3"  (1.6 m), weight 101.6 kg, last menstrual period 07/31/2019, SpO2 100 %. Exam Physical Exam  NAD, A&O NWOB Abd soft, nondistended, gravid SVE 5/60/-2, AROM clear fluid  Prenatal labs: ABO, Rh: --/--/A POS (08/04 2353) Antibody: NEG (08/04 2353) Rubella: Immune (01/19 0000) RPR: Nonreactive (01/19 0000)  HBsAg: Negative (01/19 0000)  HIV: Non-reactive (01/19 0000)  GBS: Positive/-- (07/15 0000)   Assessment/Plan: 44 yo G2P0 @ 38.4 wga p/w gHTN and contractions.  No s/s severe features. CTM Bps.  Continue pitocin. S/p AROM. Counseled regarding the risks of LGA.  BS monitoring per L&D protocol. DM coordinator pp. GBS pos - PCN per protocol.     Tyson Dense  04/26/2020, 8:32 AM

## 2020-04-26 NOTE — Consult Note (Signed)
Delivery Note:  C-section       04/26/2020  10:22 PM  I was called to the operating room at the request of the patient's obstetrician (Dr. Royston Sinner) for a primary c-section for failure to progress.  PRENATAL HX:  This is a 44 y/o G2P0010 at 37 and 4/[redacted] weeks gestation who was admitted for labor.  Her pregnancy has been complicated by A2 GDM for which she is on glyburide and an LGA fetus.  She is GBS positive with ROM x14 hours.  She received adequate penicillin prophylaxis.   DELIVERY:  Infant was vigorous at delivery, requiring no resuscitation other than standard warming, drying and stimulation.  APGARs 8 and 9.  After 5 minutes, baby left with nurse to assist parents with skin-to-skin care.   _____________________ Electronically Signed By: Clinton Gallant, MD Neonatologist

## 2020-04-26 NOTE — Progress Notes (Signed)
VERY slow cervical change Pitocin at 30 and toco still inadequate 8/90/-2, DOP with significant caput D/w pt protracted labor course Will reevaluate in 2 hours If no further progress, will recommend primary cesarean section

## 2020-04-26 NOTE — MAU Provider Note (Signed)
Chief Complaint:  Vaginal Discharge   First Provider Initiated Contact with Patient 04/25/20 2252     HPI: Donna Fowler is a 44 y.o. G2P0010 at 32w4dwho presents to maternity admissions reporting contractions.  While being evaluated for labor she has hypertension in MAU. States has been monitored closely in the office but unsure if has had elevations in her blood pressure.  Being induced on Monday for gestational diabetes, taking glyburide.  Denies vaginal bleeding or loss of fluid.  Denies headache, visual disturbance, or epigastric pain.  Good fetal movement.  Location: abdomen Quality: cramping Severity: 6/10 in pain scale Duration: hours Timing: intermittent Modifying factors: none Associated signs and symptoms: none  Pregnancy Course: [facility]. A2DM. GBS positive  Past Medical History:  Diagnosis Date  . Anxiety   . Arthritis   . Carpal tunnel syndrome on both sides   . Depression   . Endocervical polyp   . Gestational diabetes   . Gestational hypertension   . Headache   . Infertility, female   . Newborn product of in vitro fertilization (IVF) pregnancy    OB History  Gravida Para Term Preterm AB Living  2       1    SAB TAB Ectopic Multiple Live Births  1            # Outcome Date GA Lbr Len/2nd Weight Sex Delivery Anes PTL Lv  2 Current           1 SAB            Past Surgical History:  Procedure Laterality Date  . COLONOSCOPY WITH ESOPHAGOGASTRODUODENOSCOPY (EGD)  2017  . DILATATION & CURETTAGE/HYSTEROSCOPY WITH MYOSURE N/A 06/18/2018   Procedure: DILATATION & CURETTAGE/HYSTEROSCOPY WITH MYOSURE;  Surgeon: MLinda Hedges DO;  [facility] [facility]  Service: Gynecology;  Laterality: N/A;  . ELBOW ARTHROSCOPY Left 2014  . excision of lymph node     axillary   . LAPAROSCOPIC CHOLECYSTECTOMY  2009  . PILONIDAL CYST EXCISION     Family History  Problem Relation Age of Onset  . Hypercholesterolemia Father   . Hypertension Father    . Heart disease Mother        CABG  . Diabetes Mother   . Heart disease Maternal Grandfather        CABG   Social History   Tobacco Use  . Smoking status: Never Smoker  . Smokeless tobacco: Never Used  Vaping Use  . Vaping Use: Never used  Substance Use Topics  . Alcohol use: Not Currently    Alcohol/week: 0.0 standard drinks    Comment: social  . Drug use: Never   No Known Allergies Medications Prior to Admission  Medication Sig Dispense Refill Last Dose  . acetaminophen (TYLENOL) 650 MG CR tablet Take by mouth.   Past Week at Unknown time  . busPIRone (BUSPAR) 15 MG tablet Take 15 mg by mouth every evening.    04/25/2020 at Unknown time  . cetirizine (ZYRTEC) 10 MG tablet Take 10 mg by mouth at bedtime.    04/25/2020 at Unknown time  . folic acid (FOLVITE) 1 MG tablet Take 1 mg by mouth daily.   04/25/2020 at Unknown time  . glyBURIDE (DIABETA) 2.5 MG tablet Take 2.5 mg by mouth Nightly.   04/25/2020 at Unknown time  . Prenatal Vit-Fe Fumarate-FA (PRENATAL MULTIVITAMIN) TABS tablet Take 1 tablet by mouth daily at 12 noon.   04/25/2020 at Unknown time  . sertraline (  ZOLOFT) 50 MG tablet Take 50 mg by mouth at bedtime.    04/25/2020 at Unknown time  . sucralfate (CARAFATE) 1 GM/10ML suspension Take 10 mLs (1 g total) by mouth 3 (three) times daily. As needed 2700 mL 1 Past Week at Unknown time  . albuterol (PROVENTIL HFA;VENTOLIN HFA) 108 (90 Base) MCG/ACT inhaler Inhale 2 puffs into the lungs every 6 (six) hours as needed for wheezing or shortness of breath. (Patient not taking: Reported on 02/13/2020) 1 Inhaler 0   . Cholecalciferol (VITAMIN D3) 1000 units CAPS Take 1 capsule by mouth daily.    Unknown at Unknown time  . FERREX 150 150 MG capsule Take 150 mg by mouth daily.   Unknown at Unknown time  . fluticasone (FLONASE) 50 MCG/ACT nasal spray Place 1 spray into the nose daily as needed for allergies or rhinitis.    More than a month at Unknown time  . glucose blood test strip FreeStyle  Lite Strips     . glucose monitoring kit (FREESTYLE) monitoring kit      . Lancets (FREESTYLE) lancets freestyle lancets       I have reviewed patient's Past Medical Hx, Surgical Hx, Family Hx, Social Hx, medications and allergies.   ROS:  Review of Systems  Constitutional: Negative.   Gastrointestinal: Positive for abdominal pain.  Genitourinary: Negative.     Physical Exam   Patient Vitals for the past 24 hrs:  BP Temp Pulse Resp SpO2 Height Weight  04/26/20 0129 -- -- -- -- 100 % -- --  04/26/20 0125 -- -- -- -- 100 % -- --  04/26/20 0120 -- -- -- -- 100 % -- --  04/26/20 0117 -- -- -- -- 99 % -- --  04/26/20 0115 -- -- -- -- 99 % -- --  04/26/20 0110 -- -- -- -- 99 % -- --  04/26/20 0105 -- -- -- -- 98 % -- --  04/26/20 0101 126/79 -- 97 -- -- -- --  04/26/20 0100 -- -- -- -- 99 % -- --  04/26/20 0055 -- -- -- -- 99 % -- --  04/26/20 0050 -- -- -- -- 99 % -- --  04/26/20 0045 136/84 -- (!) 112 -- 94 % -- --  04/26/20 0040 -- -- -- -- 96 % -- --  04/26/20 0035 -- -- -- -- 97 % -- --  04/26/20 0030 128/84 -- 96 -- 99 % -- --  04/26/20 0025 -- -- -- -- 99 % -- --  04/26/20 0020 -- -- -- -- 98 % -- --  04/26/20 0015 137/84 -- (!) 101 -- 98 % -- --  04/26/20 0014 -- -- -- -- 98 % -- --  04/26/20 0010 -- -- -- -- 98 % -- --  04/26/20 0005 -- -- -- -- 98 % -- --  04/26/20 0000 138/81 -- 97 -- 98 % -- --  04/25/20 2359 -- -- -- -- 98 % -- --  04/25/20 2355 -- -- -- -- 98 % -- --  04/25/20 2350 -- -- -- -- 99 % -- --  04/25/20 2345 135/82 -- 97 -- 98 % -- --  04/25/20 2330 136/82 -- 96 -- -- -- --  04/25/20 2315 (!) 145/84 -- 96 -- 98 % -- --  04/25/20 2300 140/87 -- 100 -- 99 % -- --  04/25/20 2247 (!) 143/93 -- (!) 108 -- -- -- --  04/25/20 2234 136/89 -- (!) 108 -- -- -- --  04/25/20 2208  138/88 -- (!) 106 -- -- -- --  04/25/20 2204 -- 98.7 F (37.1 C) -- 20 -- '5\' 3"'  (1.6 m) 101.6 kg    Constitutional: Well-developed, well-nourished female in no acute distress.   Cardiovascular: normal rate & rhythm, no murmur Respiratory: normal effort, lung sounds clear throughout GI: Abd soft, non-tender, gravid appropriate for gestational age. Pos BS x 4 MS: Extremities nontender, no edema, normal ROM Neurologic: Alert and oriented x 4.      Dilation: 4.5 Effacement (%): 60 Station: -3 Presentation: Vertex (Roc Streett np performed bedside US) Exam by:: Lauren Cox RN   Fetal Tracing:  Baseline: 135 Variability: moderate Accelerations: 15x15 Decelerations: none  Toco: Q2-5 minutes    Labs: Results for orders placed or performed during the hospital encounter of 04/25/20 (from the past 24 hour(s))  Protein / creatinine ratio, urine     Status: None   Collection Time: 04/25/20 11:11 PM  Result Value Ref Range   Creatinine, Urine 65.39 mg/dL   Total Protein, Urine 10 mg/dL   Protein Creatinine Ratio 0.15 0.00 - 0.15 mg/mg[Cre]  CBC     Status: Abnormal   Collection Time: 04/25/20 11:53 PM  Result Value Ref Range   WBC 9.5 4.0 - 10.5 K/uL   RBC 4.25 3.87 - 5.11 MIL/uL   Hemoglobin 12.1 12.0 - 15.0 g/dL   HCT 36.0 36 - 46 %   MCV 84.7 80.0 - 100.0 fL   MCH 28.5 26.0 - 34.0 pg   MCHC 33.6 30.0 - 36.0 g/dL   RDW 16.3 (H) 11.5 - 15.5 %   Platelets 375 150 - 400 K/uL   nRBC 0.0 0.0 - 0.2 %  Comprehensive metabolic panel     Status: Abnormal   Collection Time: 04/25/20 11:53 PM  Result Value Ref Range   Sodium 133 (L) 135 - 145 mmol/L   Potassium 4.4 3.5 - 5.1 mmol/L   Chloride 105 98 - 111 mmol/L   CO2 19 (L) 22 - 32 mmol/L   Glucose, Bld 116 (H) 70 - 99 mg/dL   BUN 8 6 - 20 mg/dL   Creatinine, Ser 0.62 0.44 - 1.00 mg/dL   Calcium 9.4 8.9 - 10.3 mg/dL   Total Protein 6.1 (L) 6.5 - 8.1 g/dL   Albumin 2.5 (L) 3.5 - 5.0 g/dL   AST 16 15 - 41 U/L   ALT 14 0 - 44 U/L   Alkaline Phosphatase 108 38 - 126 U/L   Total Bilirubin 0.7 0.3 - 1.2 mg/dL   GFR calc non Af Amer >60 >60 mL/min   GFR calc Af Amer >60 >60 mL/min   Anion gap 9 5 - 15     Imaging:  No results found.  MAU Course: Orders Placed This Encounter  Procedures  . SARS Coronavirus 2 by RT PCR (hospital order, performed in [facility] hospital lab) Nasopharyngeal Nasopharyngeal Swab  . CBC  . Comprehensive metabolic panel  . Protein / creatinine ratio, urine  . RPR  . Contraction - monitoring  . External fetal heart monitoring  . Vaginal exam  . Type and screen [place]   Meds ordered this encounter  Medications  . lactated ringers infusion  . lactated ringers infusion 500-1,000 mL    MDM: Elevated BPs. None severe range. Pt asymptomatic (initially reported a headache in triage but states it is gone).  S/w Dr. Lynnette Caffey regarding patient since I can't see most recent BPs in office. Confirmed that she has  been normotensive in the office.   Preeclampsia labs normal. While waiting for labs, patient complaining of worsening pain. Cervix has changed from 3 to 4.5 per RN. RN called Dr. Lynnette Caffey for admission orders. BSUS performed for presentation; RN unable to determine due to bulging membranes.   Pt informed that the ultrasound is considered a limited OB ultrasound and is not intended to be a complete ultrasound exam.  Patient also informed that the ultrasound is not being completed with the intent of assessing for fetal or placental anomalies or any pelvic abnormalities.  Explained that the purpose of today's ultrasound is to assess for  presentation.  Patient acknowledges the purpose of the exam and the limitations of the study.   Cephalic    Assessment: 1. Elevated BP without diagnosis of hypertension   2. Indication for care in labor and delivery, antepartum   3. [redacted] weeks gestation of pregnancy   4. Determine fetal presentation using ultrasound     Plan: Dr. Lynnette Caffey to admit patient for labor  Jorje Guild, NP 04/26/2020 1:46 AM

## 2020-04-27 ENCOUNTER — Encounter (HOSPITAL_COMMUNITY): Payer: Self-pay | Admitting: Obstetrics & Gynecology

## 2020-04-27 LAB — CBC
HCT: 32.3 % — ABNORMAL LOW (ref 36.0–46.0)
Hemoglobin: 11 g/dL — ABNORMAL LOW (ref 12.0–15.0)
MCH: 28.8 pg (ref 26.0–34.0)
MCHC: 34.1 g/dL (ref 30.0–36.0)
MCV: 84.6 fL (ref 80.0–100.0)
Platelets: 288 10*3/uL (ref 150–400)
RBC: 3.82 MIL/uL — ABNORMAL LOW (ref 3.87–5.11)
RDW: 16 % — ABNORMAL HIGH (ref 11.5–15.5)
WBC: 12.6 10*3/uL — ABNORMAL HIGH (ref 4.0–10.5)
nRBC: 0 % (ref 0.0–0.2)

## 2020-04-27 MED ORDER — SIMETHICONE 80 MG PO CHEW: 80.0000 mg | CHEWABLE_TABLET | ORAL | Status: DC

## 2020-04-27 MED ORDER — KETOROLAC TROMETHAMINE 30 MG/ML IJ SOLN
30.0000 mg | Freq: Four times a day (QID) | INTRAMUSCULAR | Status: AC
  Administered 2020-04-27: 30 mg via INTRAVENOUS
  Filled 2020-04-27 (×2): qty 1

## 2020-04-27 MED ORDER — SENNOSIDES-DOCUSATE SODIUM 8.6-50 MG PO TABS
2.0000 | ORAL_TABLET | ORAL | Status: DC
  Administered 2020-04-28 – 2020-04-29 (×2): 2 via ORAL
  Filled 2020-04-27 (×2): qty 2

## 2020-04-27 MED ORDER — LACTATED RINGERS IV SOLN: INTRAVENOUS | Status: DC

## 2020-04-27 MED ORDER — WITCH HAZEL-GLYCERIN EX PADS: 1.0000 "application " | MEDICATED_PAD | CUTANEOUS | Status: DC | PRN

## 2020-04-27 MED ORDER — FLUTICASONE PROPIONATE 50 MCG/ACT NA SUSP: 1.0000 | Freq: Every day | NASAL | Status: DC | PRN

## 2020-04-27 MED ORDER — PRENATAL MULTIVITAMIN CH
1.0000 | ORAL_TABLET | Freq: Every day | ORAL | Status: DC
  Administered 2020-04-27 – 2020-04-29 (×3): 1 via ORAL
  Filled 2020-04-27 (×3): qty 1

## 2020-04-27 MED ORDER — TETANUS-DIPHTH-ACELL PERTUSSIS 5-2.5-18.5 LF-MCG/0.5 IM SUSP: 0.5000 mL | Freq: Once | INTRAMUSCULAR | Status: DC

## 2020-04-27 MED ORDER — COCONUT OIL OIL
1.0000 "application " | TOPICAL_OIL | Status: DC | PRN
  Administered 2020-04-29: 1 via TOPICAL

## 2020-04-27 MED ORDER — DIBUCAINE (PERIANAL) 1 % EX OINT: 1.0000 "application " | TOPICAL_OINTMENT | CUTANEOUS | Status: DC | PRN

## 2020-04-27 MED ORDER — DIPHENHYDRAMINE HCL 25 MG PO CAPS: 25.0000 mg | ORAL_CAPSULE | Freq: Four times a day (QID) | ORAL | Status: DC | PRN

## 2020-04-27 MED ORDER — ACETAMINOPHEN 500 MG PO TABS
1000.0000 mg | ORAL_TABLET | Freq: Four times a day (QID) | ORAL | Status: DC
  Administered 2020-04-27 – 2020-04-29 (×9): 1000 mg via ORAL
  Filled 2020-04-27 (×10): qty 2

## 2020-04-27 MED ORDER — OXYCODONE HCL 5 MG PO TABS: 5.0000 mg | ORAL_TABLET | ORAL | Status: DC | PRN

## 2020-04-27 MED ORDER — KETOROLAC TROMETHAMINE 30 MG/ML IJ SOLN
INTRAMUSCULAR | Status: AC
  Filled 2020-04-27: qty 1

## 2020-04-27 MED ORDER — OXYTOCIN-SODIUM CHLORIDE 30-0.9 UT/500ML-% IV SOLN
2.5000 [IU]/h | INTRAVENOUS | Status: AC
  Administered 2020-04-27: 2.5 [IU]/h via INTRAVENOUS

## 2020-04-27 MED ORDER — MENTHOL 3 MG MT LOZG: 1.0000 | LOZENGE | OROMUCOSAL | Status: DC | PRN

## 2020-04-27 MED ORDER — BUSPIRONE HCL 5 MG PO TABS
15.0000 mg | ORAL_TABLET | Freq: Every evening | ORAL | Status: DC
  Administered 2020-04-27 – 2020-04-28 (×2): 15 mg via ORAL
  Filled 2020-04-27 (×2): qty 3

## 2020-04-27 MED ORDER — LORATADINE 10 MG PO TABS
10.0000 mg | ORAL_TABLET | Freq: Every day | ORAL | Status: DC
  Administered 2020-04-28 – 2020-04-29 (×2): 10 mg via ORAL
  Filled 2020-04-27 (×3): qty 1

## 2020-04-27 MED ORDER — IBUPROFEN 800 MG PO TABS
800.0000 mg | ORAL_TABLET | Freq: Four times a day (QID) | ORAL | Status: DC
  Administered 2020-04-27 – 2020-04-29 (×7): 800 mg via ORAL
  Filled 2020-04-27 (×7): qty 1

## 2020-04-27 MED ORDER — SIMETHICONE 80 MG PO CHEW: 80.0000 mg | CHEWABLE_TABLET | ORAL | Status: DC | PRN

## 2020-04-27 MED ORDER — ZOLPIDEM TARTRATE 5 MG PO TABS: 5.0000 mg | ORAL_TABLET | Freq: Every evening | ORAL | Status: DC | PRN

## 2020-04-27 MED ORDER — SIMETHICONE 80 MG PO CHEW
80.0000 mg | CHEWABLE_TABLET | Freq: Three times a day (TID) | ORAL | Status: DC
  Administered 2020-04-27 – 2020-04-29 (×4): 80 mg via ORAL
  Filled 2020-04-27 (×5): qty 1

## 2020-04-27 MED ORDER — SERTRALINE HCL 50 MG PO TABS
50.0000 mg | ORAL_TABLET | Freq: Every day | ORAL | Status: DC
  Administered 2020-04-27 – 2020-04-28 (×2): 50 mg via ORAL
  Filled 2020-04-27 (×3): qty 1

## 2020-04-27 MED ORDER — HYDROMORPHONE HCL 1 MG/ML IJ SOLN: 0.2000 mg | INTRAMUSCULAR | Status: DC | PRN

## 2020-04-27 NOTE — Anesthesia Postprocedure Evaluation (Signed)
Anesthesia Post Note  Patient: Donna Fowler  Procedure(s) Performed: CESAREAN SECTION (N/A Abdomen)     Patient location during evaluation: Mother Baby Anesthesia Type: Epidural Level of consciousness: oriented and awake and alert Pain management: pain level controlled Vital Signs Assessment: post-procedure vital signs reviewed and stable Respiratory status: spontaneous breathing and respiratory function stable Cardiovascular status: blood pressure returned to baseline and stable Postop Assessment: no headache, no backache, no apparent nausea or vomiting and able to ambulate Anesthetic complications: no   No complications documented.  Last Vitals:  Vitals:   04/26/20 2346 04/27/20 0000  BP: (!) 112/96 122/74  Pulse: (!) 104 98  Resp: (!) 30 (!) 25  Temp: 36.8 C   SpO2: 99%     Last Pain:  Vitals:   04/27/20 0000  TempSrc:   PainSc: 0-No pain   Pain Goal: Patients Stated Pain Goal: 0 (04/25/20 2205)              Epidural/Spinal Function Cutaneous sensation: Tingles (04/27/20 0000), Patient able to flex knees: Yes (04/27/20 0000), Patient able to lift hips off bed: Yes (04/27/20 0000), Back pain beyond tenderness at insertion site: No (04/27/20 0000), Progressively worsening motor and/or sensory loss: No (04/27/20 0000), Bowel and/or bladder incontinence post epidural: No (04/27/20 0000)  Barnet Glasgow

## 2020-04-27 NOTE — Op Note (Signed)
PROCEDURE DATE: 04/26/20  PREOPERATIVE DIAGNOSIS: Intrauterine pregnancy at 38.5 wga, Indication: arrest of dilation  POSTOPERATIVE DIAGNOSIS:The same  PROCEDURE: Primary Low TransverseCesarean Section  SURGEON: Dr. Lucillie Garfinkel  INDICATIONS:This is a 44yo G2P0 at 38.5 wga requiring cesarean section secondary to arrest of dilation.  Augmented in early labor s/s elevated Bps. Progressed to 8cm and made no further change.  Decision made to proceed with pLTCS.The risks of cesarean section discussed with the patient included but were not limited to: bleeding which may require transfusion or reoperation; infection which may require antibiotics; injury to bowel, bladder, ureters or other surrounding organs; injury to the fetus; need for additional procedures including hysterectomy in the event of a life-threatening hemorrhage; placental abnormalities wth subsequent pregnancies, incisional problems, thromboembolic phenomenon and other postoperative/anesthesia complications. The patient agreed with the proposed plan, giving informed consent for the procedure.   FINDINGS: Viable maleinfant in vertex (OP) presentation,APGARs pending, Weight pending, Amniotic fluid clear, Intact placenta, three vessel cord. Grossly normal uterus. .  ANESTHESIA: Epidural ESTIMATED BLOOD LOSS: 188cc SPECIMENS: Placenta for routine COMPLICATIONS: None immediate  PROCEDURE IN DETAIL: The patient received intravenous antibiotics (2g Ancef) and had sequential compression devices applied to her lower extremities while in the preoperative area. Shewasthen taken to the operating roomwhere epidural anesthesiawas dosed up to surgical level andwas found to be adequate. She was then placed in a dorsal supine position with a leftward tilt,and prepped and draped in a sterile manner.A foley catheter was placed into her bladder and attached to constant gravity. After an adequate timeout was performed,  aPfannenstiel skin incision was made with scalpel and carried through to the underlying layer of fascia. The fascia was incised in the midline and this incision was extended bilaterally using the Mayo scissors. Kocher clamps were applied to the superior aspect of the fascial incision and the underlying rectus muscles were dissected off bluntly. A similar process was carried out on the inferior aspect of the facial incision. The rectus muscles were separated in the midline bluntly and the peritoneum was entered bluntly. A bladder flap was created sharply and developed bluntly.Atransverse hysterotomy was made with a scalpel and extended bilaterally bluntly. The bladder blade was then removed. The infant was successfully delivered, and cord was clamped and cut and infant was handed over to awaiting neonatology team. Uterine massage was then administered and the placenta delivered intact with three-vessel cord. Cord gases were taken. The uterus was cleared of clot and debris. The hysterotomy was closed with 0 vicryl.A second imbricating suture of 0-vicryl was used to reinforce the incision and aid in hemostasis.The fascia was closed with 0-Vicryl in a running fashion with good restoration of anatomy. The subcutaneus tissue was irrigated and was reapproximated using three interrupted plain gut stitches. The skin was closed with 4-0 Vicryl in a subcuticular fashion.  Final EBL was 188cc (all surgical site and was hemostatic at end of procedure) without any further bleeding on exam.   It's a boy!!   Pt tolerated the procedure well. All sponge/lap/needle counts were correct X 2. Pt taken to recovery room in stable condition.   Lucillie Garfinkel MD

## 2020-04-27 NOTE — Progress Notes (Signed)
MOB was referred for history of depression/anxiety. * Referral screened out by Clinical Social Worker because none of the following criteria appear to apply: ~ History of anxiety/depression during this pregnancy, or of post-partum depression following prior delivery. ~ Diagnosis of anxiety and/or depression within last 3 years OR * MOB's symptoms currently being treated with medication and/or therapy. Per further chart review and MOB's [place] records, MOB on/has active prescription for Sertraline and Buspar for anxiety and depression.    Please contact the Clinical Social Worker if needs arise, by [facility] request, or if MOB scores greater than 9/yes to question 10 on [place] Postpartum Depression Screen.   Virgie Dad Jotham Ahn, MSW, LCSW [facility] [place] [phone]

## 2020-04-27 NOTE — Progress Notes (Signed)
Subjective: Postpartum Day 0: Cesarean Delivery Patient reports tolerating PO.    Objective: Vital signs in last 24 hours: Temp:  [97.7 F (36.5 C)-98.9 F (37.2 C)] 98.7 F (37.1 C) (08/06 0500) Pulse Rate:  [75-111] 111 (08/06 0400) Resp:  [14-37] 16 (08/06 0500) BP: (91-144)/(54-98) 107/62 (08/06 0400) SpO2:  [95 %-100 %] 100 % (08/06 0500)  Physical Exam:  General: alert, cooperative and no distress Lochia: appropriate Uterine Fundus: firm Incision: healing well DVT Evaluation: No evidence of DVT seen on physical exam.  Recent Labs    04/26/20 0757 04/27/20 0402  HGB 12.4 11.0*  HCT 37.2 32.3*    Assessment/Plan: Status post Cesarean section. Doing well postoperatively.  Continue current care.  Donna Fowler 04/27/2020, 6:52 AM

## 2020-04-27 NOTE — Lactation Note (Signed)
This note was copied from a baby's chart. Lactation Consultation Note  Patient Name: Donna Fowler [id number] Date: 04/27/2020 Reason for consult: Initial assessment;1st time breastfeeding;Early term 37-38.6wks P1, 4 hour ETI infant. Mom with hx: AMA, GDM and GHTN. 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  Per mom, infant been sleepy, she made few attempts to latch at breast earlier. Mom attempted to latch infant on her left breast using the football hold position, infant took a few suckles and then stopped and infant only held breast in mouth. LC discussed hand expression and mom taught back, infant was given 4 mls of colostrum by spoon. Mom was doing STS with infant afterwards. Mom knows to [facility] according to cues, on demand, 8 to 12+ times within 24 hours.  Mom knows if infant is not latching to do STS and hand expressed giving infant back volume. Mom knows to ask RN or LC to help with latching infant at breast if needed. Reviewed Baby & Me book's Breastfeeding Basics.  Mom made aware of O/P services, breastfeeding support groups, community resources, and our phone # for post-discharge questions.    Maternal Data Formula Feeding for Exclusion: No Has patient been taught Hand Expression?: Yes Does the patient have breastfeeding experience prior to this delivery?: No  Feeding Feeding Type: Breast Fed  LATCH Score Latch: Too sleepy or reluctant, no latch achieved, no sucking elicited.  Audible Swallowing: None  Type of Nipple: Everted at rest and after stimulation  Comfort (Breast/Nipple): Soft / non-tender  Hold (Positioning): Assistance needed to correctly position infant at breast and maintain latch.  LATCH Score: 5  Interventions Interventions: Breast feeding basics reviewed;Breast compression;Skin to skin;Adjust position;Breast massage;Support pillows;Hand express;Position  options  Lactation Tools Discussed/Used WIC Program: No   Consult Status Consult Status: Follow-up Date: 04/28/20 Follow-up type: In-patient    [name] 04/27/2020, 2:23 AM

## 2020-04-27 NOTE — Addendum Note (Signed)
Addendum  created 04/27/20 9996 by Flossie Dibble, CRNA   Charge Capture section accepted, Clinical Note Signed

## 2020-04-27 NOTE — Lactation Note (Addendum)
This note was copied from a baby's chart. Lactation Consultation Note  Patient Name: Boy Thanvi Blincoe [id number] Date: 04/27/2020 Reason for consult: Mother's request;Difficult latch;Infant weight loss P1, 20 hours ETI infant with -1% weight loss. Mom was issued her [facility] [place] with her [facility].  Infant had one void and 4 stools since birth that are black greenish in color. Dad changed a stool while LC was in the room.  LC gave mom a 24 mm NS due to infant not latching, mom has large nipples and infant doesn't extended his lower jaw down well. LC pre-filled NS with 0.5 mls of EBM and infant latched on mom's left breast using the cross cradle hold, infant sustained latch will swallows and BF for 12 minutes. Infant was given total of 6 mls of colostrum at breast with NS and curve tip syringe. Mom was pleased due infant first time really latching at breast and sustaining latch. Infant has been spitty per mom.  Mom will continue to work towards latching infant at the breast, feeding on cues, 8 to 12+ times within 24 hours.  Mom knows to call RN or LC if she needs further assistance with latch. Mom was given DEBP by RN and understands to pump every 3 hours for 15 minutes on initial setting. Mom has pumped once and will follow pumping plan due infant not latching initially and now using a 24 mm NS.   Maternal Data Formula Feeding for Exclusion: No  Feeding Feeding Type: Breast Fed  LATCH Score Latch: Grasps breast easily, tongue down, lips flanged, rhythmical sucking.  Audible Swallowing: Spontaneous and intermittent  Type of Nipple: Everted at rest and after stimulation  Comfort (Breast/Nipple): Soft / non-tender  Hold (Positioning): Assistance needed to correctly position infant at breast and maintain latch.  LATCH Score: 9  Interventions Interventions: Breast feeding basics reviewed;Assisted with latch;Skin to skin;Breast massage;Hand express;Support  pillows;Adjust position;Breast compression;Position options;Expressed milk  Lactation Tools Discussed/Used Tools: Nipple Shields Nipple shield size: 24 Breast pump type: Double-Electric Breast Pump WIC Program: No Pump Review: Setup, frequency, and cleaning Initiated by:: Elayne Guerin Date initiated:: 04/27/20   Consult Status Consult Status: Follow-up Date: 04/28/20 Follow-up type: In-patient    [name] 04/27/2020, 6:19 PM

## 2020-04-27 NOTE — Anesthesia Postprocedure Evaluation (Signed)
Anesthesia Post Note  Patient: Donna Fowler  Procedure(s) Performed: CESAREAN SECTION (N/A Abdomen)     Patient location during evaluation: Mother Baby Anesthesia Type: Epidural Level of consciousness: awake and alert and oriented Pain management: satisfactory to patient Vital Signs Assessment: post-procedure vital signs reviewed and stable Respiratory status: respiratory function stable Cardiovascular status: stable Postop Assessment: no headache, no backache, epidural receding, patient able to bend at knees, no signs of nausea or vomiting and adequate PO intake Anesthetic complications: no   No complications documented.  Last Vitals:  Vitals:   04/27/20 0400 04/27/20 0500  BP: 107/62   Pulse: (!) 111   Resp: 16 16  Temp: 37.1 C 37.1 C  SpO2: 95% 100%    Last Pain:  Vitals:   04/27/20 0710  TempSrc:   PainSc: 0-No pain   Pain Goal: Patients Stated Pain Goal: 0 (04/25/20 2205)                 Katherina Mires

## 2020-04-28 ENCOUNTER — Other Ambulatory Visit (HOSPITAL_COMMUNITY): Payer: No Typology Code available for payment source | Attending: Obstetrics and Gynecology

## 2020-04-28 LAB — CBC
HCT: 30.6 % — ABNORMAL LOW (ref 36.0–46.0)
Hemoglobin: 10 g/dL — ABNORMAL LOW (ref 12.0–15.0)
MCH: 28.1 pg (ref 26.0–34.0)
MCHC: 32.7 g/dL (ref 30.0–36.0)
MCV: 86 fL (ref 80.0–100.0)
Platelets: 293 10*3/uL (ref 150–400)
RBC: 3.56 MIL/uL — ABNORMAL LOW (ref 3.87–5.11)
RDW: 16.7 % — ABNORMAL HIGH (ref 11.5–15.5)
WBC: 9.7 10*3/uL (ref 4.0–10.5)
nRBC: 0 % (ref 0.0–0.2)

## 2020-04-28 NOTE — Lactation Note (Signed)
This note was copied from a baby's chart. Lactation Consultation Note  Patient Name: Donna Fowler [id number] Date: 04/28/2020 Reason for consult: Follow-up assessment 5 43hrs old, wt loss 5% (wt loss was 1% day 1), on phototherapy. Upon entering room mom sitting on bed, baby wrapped in biliblanket, dad and grandmother at bedside. Mom reports baby received a bottle at 430pm, states unable to latch to the breast d/t difficulty pulling chin down and getting baby to open wide.  Mom's goal: - ensure baby is fed and healthy - work on latch, states this is not priority Mom reports baby with regurg, will attempt to feed again ~730p, encouraged mom to call for [facility] support if with hunger cues prior to, otherwise will f/u. Reviewed hand expression, mom easily expressed drops of colostrum, mom finger fed back to baby. Advised continue to pump q3hrs in addition to hand expression. Reviewed [facility] BF brochure with numbers for LC support. Mom voiced understanding and with no further concerns. BGilliam, RN, IBCLC   Maternal Data    Feeding Feeding Type: Bottle Fed - Formula  LATCH Score  Interventions Interventions: Hand express;Expressed milk  Lactation Tools Discussed/Used     Consult Status Consult Status: Follow-up Date: 04/28/20 Follow-up type: In-patient    [name] 04/28/2020, 5:58 PM

## 2020-04-28 NOTE — Progress Notes (Signed)
Subjective: Postpartum Day 1: Cesarean Delivery Patient reports tolerating PO and no problems voiding.    Objective: Vital signs in last 24 hours: Temp:  [97.7 F (36.5 C)-98.7 F (37.1 C)] 97.7 F (36.5 C) (08/07 0510) Pulse Rate:  [80-88] 80 (08/07 0510) Resp:  [16-18] 18 (08/07 0510) BP: (94-114)/(59-78) 104/61 (08/07 0510) SpO2:  [99 %-100 %] 99 % (08/07 0510)  Physical Exam:  General: alert, cooperative and no distress Lochia: appropriate Uterine Fundus: firm Incision: healing well DVT Evaluation: No evidence of DVT seen on physical exam.  Recent Labs    04/27/20 0402 04/28/20 0359  HGB 11.0* 10.0*  HCT 32.3* 30.6*    Assessment/Plan: Status post Cesarean section. Doing well postoperatively.  Continue current care. D/W circumcision of newborn boy. Risks reviewed. Patient states she understands and agrees. Shon Millet II 04/28/2020, 7:43 AM

## 2020-04-28 NOTE — Lactation Note (Signed)
This note was copied from a baby's chart. Lactation Consultation Note  Patient Name: Donna Fowler Date: 04/28/2020 Reason for consult: Follow-up assessment 82 40hrs old, per mom's request assisted with latching baby to the breast. Crystal S, LC placed baby in modified laid back cross cradle left breast, baby switched to left breast football hold but uninterested in latching. Finger fed baby 40ml colostrum via curved tip syringe, latched baby to right breast football hold without difficulty. Foley cup given as an option to feed baby. BF Supplementation sheet given with instructions, advised follow Formula Feeding sheet if baby does not latch to breast.  Reinforced cue based feedings, pump after feedings/ hand expression, offer EBM back to baby. Mom voiced understanding and with no further concerns. Upon leaving the room baby still latched at ~31min mark, audible swallows noted. Rex Kras, RN, IBCLC  Maternal Data    Feeding    LATCH Score Latch: Grasps breast easily, tongue down, lips flanged, rhythmical sucking.  Audible Swallowing: A few with stimulation  Type of Nipple: Everted at rest and after stimulation  Comfort (Breast/Nipple): Filling, red/small blisters or bruises, mild/mod discomfort  Hold (Positioning): Assistance needed to correctly position infant at breast and maintain latch.  LATCH Score: 7  Interventions Interventions: Assisted with latch  Lactation Tools Discussed/Used     Consult Status Consult Status: Follow-up Date: 04/29/20 Follow-up type: In-patient    Donna Fowler 04/28/2020, 8:08 PM

## 2020-04-29 MED ORDER — IBUPROFEN 600 MG PO TABS: 600.0000 mg | ORAL_TABLET | Freq: Four times a day (QID) | ORAL | 0 refills | Status: AC | PRN

## 2020-04-29 NOTE — Lactation Note (Signed)
This note was copied from a baby's chart. Lactation Consultation Note  Patient Name: Donna Fowler [id number] Date: 04/29/2020 Reason for consult: Follow-up assessment   Mother paged for latch assistance.Marland Kitchen Positioned mother in chair with infant in football hold. Infant took 18 ml of ebm with the #5 feeding tube at the breast. Parents very excited. Infant continued to suckles and observed swallows.  Infant was then given another 30 ml of formula at the breast .,mother reports that this was a good feeding for infant. She felt strong tugging . Infant had wide open mouth with flanged lips.   Mother reports that her left nipple is larger. She has a #24 NS to if needed. Tips to try and get infant latched .  Discussed treatment and prevention of engorgement. Mother is a  Mother to continue to cue base feed infant and feed at least 8-12 times or more in 24 hours and advised to allow for cluster feeding infant as needed.  Mother to continue to due STS. Mother is aware of available LC services at [facility], BFSG'S, OP Dept, and phone # for questions or concerns about breastfeeding.  Mother receptive to all teaching and plan of care.      Maternal Data    Feeding Feeding Type: Breast Fed  LATCH Score Latch: Grasps breast easily, tongue down, lips flanged, rhythmical sucking.  Audible Swallowing: Spontaneous and intermittent  Type of Nipple: Everted at rest and after stimulation  Comfort (Breast/Nipple): Filling, red/small blisters or bruises, mild/mod discomfort  Hold (Positioning): Assistance needed to correctly position infant at breast and maintain latch.  LATCH Score: 8  Interventions Interventions: Assisted with latch;Skin to skin;Hand express;Breast compression;Adjust position;Support pillows;Position options;Hand pump;DEBP  Lactation Tools Discussed/Used Tools: 32F feeding tube / Syringe   Consult Status Consult Status: Complete Date: 04/29/20 Follow-up type:  In-patient    Jess Barters [facility] 04/29/2020, 12:21 PM

## 2020-04-29 NOTE — Lactation Note (Addendum)
This note was copied from a baby's chart. Lactation Consultation Note  Patient Name: Donna Fowler [id number] Date: 04/29/2020 Reason for consult: Follow-up assessment   Mother reports that she just pumped and has milk at the bedside to give infant when she wakes for next feeding. Mother reports that she is having difficulty with latch and she would like more assistance with latching infant.   Discussed using a #5 fr feeding tube and mother agreeable. Mother to page when infant wakes.    Maternal Data    Feeding Feeding Type: Breast Fed Nipple Type: Slow - flow  LATCH Score Latch: Grasps breast easily, tongue down, lips flanged, rhythmical sucking.  Audible Swallowing: Spontaneous and intermittent  Type of Nipple: Everted at rest and after stimulation  Comfort (Breast/Nipple): Filling, red/small blisters or bruises, mild/mod discomfort  Hold (Positioning): Assistance needed to correctly position infant at breast and maintain latch.  LATCH Score: 8  Interventions Interventions: Assisted with latch;Skin to skin;Hand express;Breast compression;Adjust position;Support pillows;Position options;Hand pump;DEBP  Lactation Tools Discussed/Used Tools: 55F feeding tube / Syringe   Consult Status Consult Status: Complete Date: 04/29/20 Follow-up type: In-patient    Jess Barters [facility] 04/29/2020, 10:42 AM

## 2020-04-29 NOTE — Discharge Summary (Signed)
Postpartum Discharge Summary  Date of Service updated 04/29/20     Patient Name: Donna Fowler DOB: 11-25-1975 MRN: [id number]  Date of admission: 04/25/2020 Delivery date:04/26/2020  Delivering provider: Tyson Dense  Date of discharge: 04/29/2020  Admitting diagnosis: Indication for care in labor or delivery [O75.9] Intrauterine pregnancy: [redacted]w[redacted]d    Secondary diagnosis:  Active Problems:   Indication for care in labor or delivery  Additional problems:     Discharge diagnosis: Term Pregnancy Delivered and GDM A2                                              Post partum procedures: Augmentation: AROM and Pitocin Complications: None  Hospital course: Onset of Labor With Unplanned C/S   44y.o. yo G2P1011 at 349w4das admitted in Latent Labor on 04/25/2020. Patient had a labor course significant for arrest of dilation. The patient went for cesarean section due to Arrest of Dilation. Delivery details as follows: Membrane Rupture Time/Date: 44:53 AM ,04/26/2020   Delivery Method:C-Section, Low Transverse  Details of operation can be found in separate operative note. Patient had an uncomplicated postpartum course.  She is ambulating,tolerating a regular diet, passing flatus, and urinating well.  Patient is discharged home in stable condition 04/29/20.  Newborn Data: Birth date:04/26/2020  Birth time:10:11 PM  Gender:Female  Living status:Living  Apgars:8 ,9  Weight:3875 g   Magnesium Sulfate received: No BMZ received: No Rhophylac:No MMR:No T-DaP:Given prenatally Flu: Yes Transfusion:No  Physical exam  Vitals:   04/28/20 0510 04/28/20 1400 04/28/20 2123 04/29/20 0541  BP: 104/61 (!) 107/56 114/75 108/68  Pulse: 80 92 79 70  Resp: 18 18    Temp: 97.7 F (36.5 C) 98.8 F (37.1 C) 98.1 F (36.7 C) (!) 97.4 F (36.3 C)  TempSrc:   Oral Oral  SpO2: 99%  100%   Weight:      Height:       General: alert, cooperative and no distress Lochia: appropriate Uterine Fundus:  firm Incision: Healing well with no significant drainage DVT Evaluation: No evidence of DVT seen on physical exam. Labs: Lab Results  Component Value Date   WBC 9.7 04/28/2020   HGB 10.0 (L) 04/28/2020   HCT 30.6 (L) 04/28/2020   MCV 86.0 04/28/2020   PLT 293 04/28/2020   CMP Latest Ref Rng & Units 04/25/2020  Glucose 70 - 99 mg/dL 116(H)  BUN 6 - 20 mg/dL 8  Creatinine 0.44 - 1.00 mg/dL 0.62  Sodium 135 - 145 mmol/L 133(L)  Potassium 3.5 - 5.1 mmol/L 4.4  Chloride 98 - 111 mmol/L 105  CO2 22 - 32 mmol/L 19(L)  Calcium 8.9 - 10.3 mg/dL 9.4  Total Protein 6.5 - 8.1 g/dL 6.1(L)  Total Bilirubin 0.3 - 1.2 mg/dL 0.7  Alkaline Phos 38 - 126 U/L 108  AST 15 - 41 U/L 16  ALT 0 - 44 U/L 14   Edinburgh Score: Edinburgh Postnatal Depression Scale Screening Tool 04/27/2020  I have been able to laugh and see the funny side of things. 0  I have looked forward with enjoyment to things. 0  I have blamed myself unnecessarily when things went wrong. 1  I have been anxious or worried for no good reason. 1  I have felt scared or panicky for no good reason. 0  Things have been getting on  top of me. 1  I have been so unhappy that I have had difficulty sleeping. 0  I have felt sad or miserable. 1  I have been so unhappy that I have been crying. 1  The thought of harming myself has occurred to me. 0  Edinburgh Postnatal Depression Scale Total 5      After visit meds:  Allergies as of 04/29/2020   No Known Allergies     Medication List    STOP taking these medications   folic acid 1 MG tablet Commonly known as: FOLVITE   freestyle lancets   glucose blood test strip   glucose monitoring kit monitoring kit   glyBURIDE 2.5 MG tablet Commonly known as: DIABETA   sucralfate 1 GM/10ML suspension Commonly known as: CARAFATE     TAKE these medications   acetaminophen 650 MG CR tablet Commonly known as: TYLENOL Take 1,300 mg by mouth every 8 (eight) hours as needed for pain.    albuterol 108 (90 Base) MCG/ACT inhaler Commonly known as: VENTOLIN HFA Inhale 2 puffs into the lungs every 6 (six) hours as needed for wheezing or shortness of breath.   busPIRone 15 MG tablet Commonly known as: BUSPAR Take 15 mg by mouth every evening.   cetirizine 10 MG tablet Commonly known as: ZYRTEC Take 10 mg by mouth at bedtime.   ibuprofen 600 MG tablet Commonly known as: ADVIL Take 1 tablet (600 mg total) by mouth every 6 (six) hours as needed.   prenatal multivitamin Tabs tablet Take 1 tablet by mouth daily at 12 noon.   sertraline 50 MG tablet Commonly known as: ZOLOFT Take 50 mg by mouth at bedtime.        Discharge home in stable condition Infant Feeding: Breast Infant Disposition:home with mother Discharge instruction: per After Visit Summary and Postpartum booklet. Activity: Advance as tolerated. Pelvic rest for 6 weeks.  Diet: routine diet Anticipated Birth Control: Unsure Postpartum Appointment:6 weeks Additional Postpartum F/U:  Future Appointments:No future appointments. Follow up Visit:      04/29/2020 Allena Katz, MD

## 2020-04-30 ENCOUNTER — Inpatient Hospital Stay (HOSPITAL_COMMUNITY): Payer: No Typology Code available for payment source

## 2020-04-30 ENCOUNTER — Inpatient Hospital Stay (HOSPITAL_COMMUNITY)
Admission: AD | Admit: 2020-04-30 | Payer: No Typology Code available for payment source | Source: Home / Self Care | Admitting: Obstetrics and Gynecology

## 2020-07-18 ENCOUNTER — Other Ambulatory Visit: Payer: Self-pay | Admitting: Internal Medicine

## 2020-07-18 DIAGNOSIS — Z1231 Encounter for screening mammogram for malignant neoplasm of breast: Secondary | ICD-10-CM

## 2020-11-27 ENCOUNTER — Other Ambulatory Visit: Payer: Self-pay | Admitting: Internal Medicine

## 2021-01-02 ENCOUNTER — Other Ambulatory Visit: Payer: Self-pay

## 2021-01-02 MED FILL — Buspirone HCl Tab 15 MG: ORAL | 90 days supply | Qty: 180 | Fill #0 | Status: AC

## 2021-01-03 ENCOUNTER — Other Ambulatory Visit: Payer: Self-pay

## 2021-01-10 DIAGNOSIS — R8290 Unspecified abnormal findings in urine: Secondary | ICD-10-CM | POA: Diagnosis not present

## 2021-01-10 DIAGNOSIS — R7989 Other specified abnormal findings of blood chemistry: Secondary | ICD-10-CM | POA: Diagnosis not present

## 2021-01-10 DIAGNOSIS — Z6836 Body mass index (BMI) 36.0-36.9, adult: Secondary | ICD-10-CM | POA: Diagnosis not present

## 2021-01-10 DIAGNOSIS — Z1231 Encounter for screening mammogram for malignant neoplasm of breast: Secondary | ICD-10-CM | POA: Diagnosis not present

## 2021-01-10 DIAGNOSIS — Z23 Encounter for immunization: Secondary | ICD-10-CM | POA: Diagnosis not present

## 2021-01-10 DIAGNOSIS — J302 Other seasonal allergic rhinitis: Secondary | ICD-10-CM | POA: Diagnosis not present

## 2021-01-10 DIAGNOSIS — K219 Gastro-esophageal reflux disease without esophagitis: Secondary | ICD-10-CM | POA: Diagnosis not present

## 2021-01-10 DIAGNOSIS — E7800 Pure hypercholesterolemia, unspecified: Secondary | ICD-10-CM | POA: Diagnosis not present

## 2021-01-10 DIAGNOSIS — R7309 Other abnormal glucose: Secondary | ICD-10-CM | POA: Diagnosis not present

## 2021-01-11 ENCOUNTER — Other Ambulatory Visit: Payer: Self-pay

## 2021-01-11 MED ORDER — VITAMIN D (ERGOCALCIFEROL) 1.25 MG (50000 UNIT) PO CAPS
ORAL_CAPSULE | ORAL | 5 refills | Status: DC
  Filled 2021-01-11: qty 4, 28d supply, fill #0
  Filled 2021-02-11: qty 12, 84d supply, fill #1
  Filled 2021-05-22: qty 8, 56d supply, fill #2

## 2021-01-16 ENCOUNTER — Other Ambulatory Visit: Payer: Self-pay

## 2021-01-17 ENCOUNTER — Other Ambulatory Visit: Payer: Self-pay

## 2021-01-17 DIAGNOSIS — Z1331 Encounter for screening for depression: Secondary | ICD-10-CM | POA: Diagnosis not present

## 2021-01-17 DIAGNOSIS — R7309 Other abnormal glucose: Secondary | ICD-10-CM | POA: Diagnosis not present

## 2021-01-17 DIAGNOSIS — Z0000 Encounter for general adult medical examination without abnormal findings: Secondary | ICD-10-CM | POA: Diagnosis not present

## 2021-01-17 DIAGNOSIS — Z6835 Body mass index (BMI) 35.0-35.9, adult: Secondary | ICD-10-CM | POA: Diagnosis not present

## 2021-01-17 DIAGNOSIS — K219 Gastro-esophageal reflux disease without esophagitis: Secondary | ICD-10-CM | POA: Diagnosis not present

## 2021-01-17 MED ORDER — FLUTICASONE PROPIONATE 50 MCG/ACT NA SUSP
NASAL | 11 refills | Status: DC
  Filled 2021-01-17: qty 16, 30d supply, fill #0

## 2021-01-17 MED ORDER — SERTRALINE HCL 50 MG PO TABS
1.0000 | ORAL_TABLET | Freq: Every day | ORAL | 1 refills | Status: DC
  Filled 2021-01-17: qty 90, 90d supply, fill #0

## 2021-01-17 MED ORDER — FAMOTIDINE 20 MG PO TABS
ORAL_TABLET | ORAL | 1 refills | Status: DC
  Filled 2021-01-17 – 2021-02-11 (×2): qty 90, 90d supply, fill #0
  Filled 2021-05-22: qty 90, 90d supply, fill #1

## 2021-01-17 MED ORDER — BUSPIRONE HCL 15 MG PO TABS
ORAL_TABLET | ORAL | 1 refills | Status: DC
  Filled 2021-01-17 – 2021-04-14 (×2): qty 180, 90d supply, fill #0
  Filled 2021-08-04: qty 180, 90d supply, fill #1

## 2021-01-29 ENCOUNTER — Other Ambulatory Visit: Payer: Self-pay

## 2021-02-06 ENCOUNTER — Other Ambulatory Visit: Payer: Self-pay

## 2021-02-06 ENCOUNTER — Ambulatory Visit
Admission: RE | Admit: 2021-02-06 | Discharge: 2021-02-06 | Disposition: A | Discharge status: Home / Self Care | Payer: 59 | Source: Ambulatory Visit | Attending: Internal Medicine | Admitting: Internal Medicine

## 2021-02-06 DIAGNOSIS — Z1231 Encounter for screening mammogram for malignant neoplasm of breast: Secondary | ICD-10-CM | POA: Insufficient documentation

## 2021-02-11 ENCOUNTER — Other Ambulatory Visit: Payer: Self-pay

## 2021-02-13 ENCOUNTER — Other Ambulatory Visit: Payer: Self-pay

## 2021-02-13 MED ORDER — AMOXICILLIN-POT CLAVULANATE 875-125 MG PO TABS
ORAL_TABLET | ORAL | 0 refills | Status: DC
  Filled 2021-02-13: qty 20, 10d supply, fill #0

## 2021-04-15 ENCOUNTER — Other Ambulatory Visit: Payer: Self-pay

## 2021-05-06 ENCOUNTER — Other Ambulatory Visit: Payer: Self-pay

## 2021-05-06 MED FILL — Sertraline HCl Tab 50 MG: ORAL | 90 days supply | Qty: 90 | Fill #0 | Status: AC

## 2021-05-23 ENCOUNTER — Other Ambulatory Visit: Payer: Self-pay

## 2021-06-17 ENCOUNTER — Other Ambulatory Visit: Payer: Self-pay

## 2021-06-18 ENCOUNTER — Other Ambulatory Visit: Payer: Self-pay

## 2021-06-18 MED ORDER — CIPROFLOXACIN-DEXAMETHASONE 0.3-0.1 % OT SUSP
OTIC | 0 refills | Status: DC
  Filled 2021-06-18: qty 7.5, 10d supply, fill #0

## 2021-06-27 ENCOUNTER — Other Ambulatory Visit: Payer: Self-pay

## 2021-06-27 DIAGNOSIS — Z6834 Body mass index (BMI) 34.0-34.9, adult: Secondary | ICD-10-CM | POA: Diagnosis not present

## 2021-06-27 DIAGNOSIS — Z01419 Encounter for gynecological examination (general) (routine) without abnormal findings: Secondary | ICD-10-CM | POA: Diagnosis not present

## 2021-06-27 DIAGNOSIS — N920 Excessive and frequent menstruation with regular cycle: Secondary | ICD-10-CM | POA: Diagnosis not present

## 2021-06-27 MED ORDER — TRANEXAMIC ACID 650 MG PO TABS
ORAL_TABLET | ORAL | 1 refills | Status: DC
  Filled 2021-06-27: qty 60, 10d supply, fill #0

## 2021-07-09 ENCOUNTER — Other Ambulatory Visit: Payer: Self-pay

## 2021-07-09 DIAGNOSIS — E7800 Pure hypercholesterolemia, unspecified: Secondary | ICD-10-CM | POA: Diagnosis not present

## 2021-07-09 DIAGNOSIS — Z0000 Encounter for general adult medical examination without abnormal findings: Secondary | ICD-10-CM | POA: Diagnosis not present

## 2021-07-09 DIAGNOSIS — R7309 Other abnormal glucose: Secondary | ICD-10-CM | POA: Diagnosis not present

## 2021-07-09 DIAGNOSIS — K219 Gastro-esophageal reflux disease without esophagitis: Secondary | ICD-10-CM | POA: Diagnosis not present

## 2021-07-09 DIAGNOSIS — Z6835 Body mass index (BMI) 35.0-35.9, adult: Secondary | ICD-10-CM | POA: Diagnosis not present

## 2021-07-09 DIAGNOSIS — J302 Other seasonal allergic rhinitis: Secondary | ICD-10-CM | POA: Diagnosis not present

## 2021-07-09 MED FILL — Albuterol Sulfate Inhal Aero 108 MCG/ACT (90MCG Base Equiv): RESPIRATORY_TRACT | 25 days supply | Qty: 18 | Fill #0 | Status: AC

## 2021-07-09 MED FILL — Fluticasone Propionate Nasal Susp 50 MCG/ACT: NASAL | 30 days supply | Qty: 16 | Fill #0 | Status: AC

## 2021-07-10 ENCOUNTER — Other Ambulatory Visit: Payer: Self-pay

## 2021-07-17 ENCOUNTER — Other Ambulatory Visit: Payer: Self-pay

## 2021-07-17 DIAGNOSIS — R7309 Other abnormal glucose: Secondary | ICD-10-CM | POA: Diagnosis not present

## 2021-07-17 DIAGNOSIS — K219 Gastro-esophageal reflux disease without esophagitis: Secondary | ICD-10-CM | POA: Diagnosis not present

## 2021-07-17 DIAGNOSIS — Z23 Encounter for immunization: Secondary | ICD-10-CM | POA: Diagnosis not present

## 2021-07-17 DIAGNOSIS — R058 Other specified cough: Secondary | ICD-10-CM | POA: Diagnosis not present

## 2021-07-17 MED ORDER — SERTRALINE HCL 100 MG PO TABS
ORAL_TABLET | ORAL | 1 refills | Status: DC
  Filled 2021-07-17: qty 90, 90d supply, fill #0
  Filled 2021-11-25: qty 90, 90d supply, fill #1

## 2021-07-17 MED ORDER — HYDROCOD POLST-CPM POLST ER 10-8 MG/5ML PO SUER
ORAL | 0 refills | Status: DC
  Filled 2021-07-17: qty 115, 23d supply, fill #0

## 2021-07-17 MED ORDER — PANTOPRAZOLE SODIUM 40 MG PO TBEC
DELAYED_RELEASE_TABLET | ORAL | 3 refills | Status: DC
  Filled 2021-07-17: qty 60, 30d supply, fill #0
  Filled 2021-09-17: qty 60, 30d supply, fill #1
  Filled 2021-11-25: qty 60, 30d supply, fill #2
  Filled 2022-02-10: qty 60, 30d supply, fill #3

## 2021-07-17 MED ORDER — CEPHALEXIN 500 MG PO CAPS
ORAL_CAPSULE | ORAL | 0 refills | Status: DC
  Filled 2021-07-17: qty 30, 10d supply, fill #0

## 2021-07-17 MED ORDER — ALPRAZOLAM 0.25 MG PO TABS
ORAL_TABLET | ORAL | 0 refills | Status: DC
  Filled 2021-07-17: qty 30, 30d supply, fill #0

## 2021-07-17 MED ORDER — PREDNISONE 10 MG PO TABS
ORAL_TABLET | ORAL | 0 refills | Status: DC
  Filled 2021-07-17: qty 12, 6d supply, fill #0

## 2021-07-17 MED ORDER — VITAMIN D (ERGOCALCIFEROL) 1.25 MG (50000 UNIT) PO CAPS
ORAL_CAPSULE | ORAL | 5 refills | Status: DC
  Filled 2021-07-17: qty 4, 28d supply, fill #0
  Filled 2021-09-01: qty 12, 84d supply, fill #1
  Filled 2021-11-25: qty 8, 56d supply, fill #2

## 2021-08-05 ENCOUNTER — Other Ambulatory Visit: Payer: Self-pay

## 2021-09-01 ENCOUNTER — Other Ambulatory Visit: Payer: Self-pay

## 2021-09-02 ENCOUNTER — Other Ambulatory Visit: Payer: Self-pay

## 2021-09-02 MED ORDER — FLUTICASONE PROPIONATE 50 MCG/ACT NA SUSP
NASAL | 11 refills | Status: DC
  Filled 2021-09-02: qty 16, 30d supply, fill #0
  Filled 2021-11-25: qty 16, 30d supply, fill #1

## 2021-09-02 MED ORDER — ALBUTEROL SULFATE HFA 108 (90 BASE) MCG/ACT IN AERS
INHALATION_SPRAY | RESPIRATORY_TRACT | 5 refills | Status: DC
  Filled 2021-09-02: qty 18, 25d supply, fill #0

## 2021-09-17 ENCOUNTER — Other Ambulatory Visit: Payer: Self-pay

## 2021-11-25 ENCOUNTER — Other Ambulatory Visit: Payer: Self-pay

## 2021-11-26 ENCOUNTER — Other Ambulatory Visit: Payer: Self-pay

## 2021-11-27 ENCOUNTER — Other Ambulatory Visit: Payer: Self-pay

## 2021-11-27 MED ORDER — BUSPIRONE HCL 15 MG PO TABS
ORAL_TABLET | ORAL | 1 refills | Status: DC
  Filled 2021-11-27: qty 180, 90d supply, fill #0

## 2021-12-10 ENCOUNTER — Other Ambulatory Visit: Payer: Self-pay

## 2021-12-25 ENCOUNTER — Other Ambulatory Visit: Payer: Self-pay | Admitting: Internal Medicine

## 2021-12-25 DIAGNOSIS — Z1231 Encounter for screening mammogram for malignant neoplasm of breast: Secondary | ICD-10-CM

## 2022-01-15 DIAGNOSIS — E7800 Pure hypercholesterolemia, unspecified: Secondary | ICD-10-CM | POA: Diagnosis not present

## 2022-01-15 DIAGNOSIS — R7309 Other abnormal glucose: Secondary | ICD-10-CM | POA: Diagnosis not present

## 2022-01-15 DIAGNOSIS — Z6834 Body mass index (BMI) 34.0-34.9, adult: Secondary | ICD-10-CM | POA: Diagnosis not present

## 2022-01-22 ENCOUNTER — Other Ambulatory Visit: Payer: Self-pay

## 2022-01-22 ENCOUNTER — Telehealth: Payer: Self-pay

## 2022-01-22 DIAGNOSIS — Z0000 Encounter for general adult medical examination without abnormal findings: Secondary | ICD-10-CM | POA: Diagnosis not present

## 2022-01-22 DIAGNOSIS — E1165 Type 2 diabetes mellitus with hyperglycemia: Secondary | ICD-10-CM | POA: Diagnosis not present

## 2022-01-22 DIAGNOSIS — Z1211 Encounter for screening for malignant neoplasm of colon: Secondary | ICD-10-CM

## 2022-01-22 DIAGNOSIS — E7800 Pure hypercholesterolemia, unspecified: Secondary | ICD-10-CM | POA: Diagnosis not present

## 2022-01-22 DIAGNOSIS — Z6834 Body mass index (BMI) 34.0-34.9, adult: Secondary | ICD-10-CM | POA: Diagnosis not present

## 2022-01-22 DIAGNOSIS — Z1331 Encounter for screening for depression: Secondary | ICD-10-CM | POA: Diagnosis not present

## 2022-01-22 DIAGNOSIS — R0789 Other chest pain: Secondary | ICD-10-CM | POA: Diagnosis not present

## 2022-01-22 MED ORDER — FLUTICASONE PROPIONATE 50 MCG/ACT NA SUSP
NASAL | 11 refills | Status: AC
  Filled 2022-01-22: qty 16, 30d supply, fill #0
  Filled 2022-04-01: qty 16, 30d supply, fill #1
  Filled 2022-05-12: qty 16, 30d supply, fill #2
  Filled 2022-06-22: qty 16, 30d supply, fill #3
  Filled 2022-10-21: qty 16, 30d supply, fill #4

## 2022-01-22 MED ORDER — NA SULFATE-K SULFATE-MG SULF 17.5-3.13-1.6 GM/177ML PO SOLN
1.0000 | Freq: Once | ORAL | 0 refills | Status: AC
  Filled 2022-01-22: qty 354, 1d supply, fill #0

## 2022-01-22 MED ORDER — BUSPIRONE HCL 15 MG PO TABS
ORAL_TABLET | ORAL | 1 refills | Status: DC
  Filled 2022-01-22 – 2022-04-01 (×2): qty 180, 90d supply, fill #0
  Filled 2022-06-22: qty 180, 90d supply, fill #1

## 2022-01-22 MED ORDER — ERGOCALCIFEROL 1.25 MG (50000 UT) PO CAPS
ORAL_CAPSULE | ORAL | 5 refills | Status: DC
  Filled 2022-01-22: qty 4, 28d supply, fill #0
  Filled 2022-04-01: qty 4, 28d supply, fill #1
  Filled 2022-06-22: qty 4, 28d supply, fill #2
  Filled 2022-10-06 – 2022-11-02 (×2): qty 4, 28d supply, fill #3

## 2022-01-22 MED ORDER — OZEMPIC (0.25 OR 0.5 MG/DOSE) 2 MG/3ML ~~LOC~~ SOPN
PEN_INJECTOR | SUBCUTANEOUS | 5 refills | Status: DC
Start: 2022-01-22 — End: 2022-08-18
  Filled 2022-01-22: qty 3, 56d supply, fill #0
  Filled 2022-01-24 – 2022-02-10 (×2): qty 3, 28d supply, fill #0
  Filled 2022-04-01: qty 3, 28d supply, fill #1
  Filled 2022-08-15: qty 3, 28d supply, fill #2

## 2022-01-22 MED ORDER — SERTRALINE HCL 100 MG PO TABS
ORAL_TABLET | ORAL | 1 refills | Status: DC
  Filled 2022-01-22 – 2022-02-10 (×2): qty 90, 90d supply, fill #0
  Filled 2022-04-01 – 2022-06-22 (×2): qty 90, 90d supply, fill #1

## 2022-01-22 MED ORDER — ATORVASTATIN CALCIUM 10 MG PO TABS
10.0000 mg | ORAL_TABLET | Freq: Every day | ORAL | 1 refills | Status: DC
  Filled 2022-01-22: qty 90, 90d supply, fill #0
  Filled 2022-04-01: qty 90, 90d supply, fill #1

## 2022-01-22 NOTE — Telephone Encounter (Signed)
Gastroenterology Pre-Procedure Review ? ?Request Date: 03/06/22 ?Requesting Physician: Dr. Vicente Males ? ?PATIENT REVIEW QUESTIONS: The patient responded to the following health history questions as indicated:   ? ?1. Are you having any GI issues? yes (reflux occasionally history of peptic ulcers sometimes it flares up taking protonix is useful) ?2. Do you have a personal history of Polyps? no ?3. Do you have a family history of Colon Cancer or Polyps? yes (colon polyps father) ?4. Diabetes Mellitus? no ?5. Joint replacements in the past 12 months?no ?6. Major health problems in the past 3 months?no ?7. Any artificial heart valves, MVP, or defibrillator?no ?   ?MEDICATIONS & ALLERGIES:    ?Patient reports the following regarding taking any anticoagulation/antiplatelet therapy:   ?Plavix, Coumadin, Eliquis, Xarelto, Lovenox, Pradaxa, Brilinta, or Effient? no ?Aspirin? no ? ?Patient confirms/reports the following medications:  ?Current Outpatient Medications  ?Medication Sig Dispense Refill  ? acetaminophen (TYLENOL) 650 MG CR tablet Take 1,300 mg by mouth every 8 (eight) hours as needed for pain.     ? albuterol (PROVENTIL HFA;VENTOLIN HFA) 108 (90 Base) MCG/ACT inhaler Inhale 2 puffs into the lungs every 6 (six) hours as needed for wheezing or shortness of breath. (Patient not taking: Reported on 02/13/2020) 1 Inhaler 0  ? albuterol (VENTOLIN HFA) 108 (90 Base) MCG/ACT inhaler INHALE 2 PUFFS BY MOUTH EVERY 6 HOURS AS NEEDED FOR WHEEZING 18 g 5  ? albuterol (VENTOLIN HFA) 108 (90 Base) MCG/ACT inhaler INHALE 2 PUFFS BY MOUTH EVERY 6 HOURS AS NEEDED FOR WHEEZING 18 g 5  ? ALPRAZolam (XANAX) 0.25 MG tablet Take 1 tablet (0.25 mg total) by mouth once daily as needed for Sleep for up to 30 days 30 tablet 0  ? amoxicillin-clavulanate (AUGMENTIN) 875-125 MG tablet One po BID x 10 days 20 tablet 0  ? atorvastatin (LIPITOR) 10 MG tablet Take 1 tablet (10 mg total) by mouth once daily 90 tablet 1  ? busPIRone (BUSPAR) 15 MG tablet  Take 15 mg by mouth every evening.     ? busPIRone (BUSPAR) 15 MG tablet Take 1 tablet (15 mg total) by mouth 2 (two) times daily 180 tablet 1  ? busPIRone (BUSPAR) 15 MG tablet Take 1 tablet (15 mg total) by mouth 2 (two) times daily 180 tablet 1  ? cephALEXin (KEFLEX) 500 MG capsule Take 1 capsule (500 mg total) by mouth 3 (three) times daily for 10 days 30 capsule 0  ? cetirizine (ZYRTEC) 10 MG tablet Take 10 mg by mouth at bedtime.     ? chlorpheniramine-HYDROcodone (TUSSIONEX) 10-8 MG/5ML SUER Take 5 mLs by mouth at bedtime as needed for up to 10 days 115 mL 0  ? ciprofloxacin-dexamethasone (CIPRODEX) OTIC suspension Location: Right Ear. 4 drops right ear BID x 4 days 7.5 mL 0  ? ergocalciferol (VITAMIN D2) 1.25 MG (50000 UT) capsule Take one capsule by mouth once a week 4 capsule 5  ? famotidine (PEPCID) 20 MG tablet Take 1 tablet (20 mg total) by mouth nightly as needed for Heartburn 90 tablet 1  ? fluticasone (FLONASE) 50 MCG/ACT nasal spray PLACE 2 SPRAYS INTO BOTH NOSTRILS ONCE DAILY 16 g 11  ? fluticasone (FLONASE) 50 MCG/ACT nasal spray Place 2 sprays into both nostrils once daily 16 g 11  ? fluticasone (FLONASE) 50 MCG/ACT nasal spray PLACE 2 SPRAYS INTO BOTH NOSTRILS ONCE DAILY 16 g 11  ? fluticasone (FLONASE) 50 MCG/ACT nasal spray Place 2 sprays into both nostrils once daily 16 g 11  ?  ibuprofen (ADVIL) 600 MG tablet Take 1 tablet (600 mg total) by mouth every 6 (six) hours as needed. 60 tablet 0  ? pantoprazole (PROTONIX) 40 MG tablet Take 1 tablet by mouth once daily 60 tablet 3  ? predniSONE (DELTASONE) 10 MG tablet 3,3,2,2,1,1 and stop 12 tablet 0  ? Prenatal Vit-Fe Fumarate-FA (PRENATAL MULTIVITAMIN) TABS tablet Take 1 tablet by mouth daily at 12 noon.    ? Semaglutide,0.25 or 0.'5MG'$ /DOS, (OZEMPIC, 0.25 OR 0.5 MG/DOSE,) 2 MG/3ML SOPN Inject 0.25 mg subcutaneously once a week 3 mL 5  ? sertraline (ZOLOFT) 100 MG tablet Take one tablet by mouth daily 90 tablet 1  ? sertraline (ZOLOFT) 50 MG tablet  Take 50 mg by mouth at bedtime.     ? sertraline (ZOLOFT) 50 MG tablet TAKE 1 TABLET (50 MG TOTAL) BY MOUTH ONCE DAILY 90 tablet 1  ? sertraline (ZOLOFT) 50 MG tablet TAKE 1 TABLET BY MOUTH ONCE DAILY 90 tablet 1  ? sertraline (ZOLOFT) 50 MG tablet TAKE 1 TABLET (50 MG TOTAL) BY MOUTH ONCE DAILY 90 tablet 1  ? sertraline (ZOLOFT) 50 MG tablet Take 1 tablet (50 mg total) by mouth once daily 90 tablet 1  ? tranexamic acid (LYSTEDA) 650 MG TABS tablet Take 2 tablets 3 times a day by oral route. 60 tablet 1  ? Vitamin D, Ergocalciferol, (DRISDOL) 1.25 MG (50000 UNIT) CAPS capsule Take 1 capsule by mouth once weekly 4 capsule 5  ? ?No current facility-administered medications for this visit.  ? ? ?Patient confirms/reports the following allergies:  ?No Known Allergies ? ?No orders of the defined types were placed in this encounter. ? ? ?AUTHORIZATION INFORMATION ?Primary Insurance: ?1D#: ?Group #: ? ?Secondary Insurance: ?1D#: ?Group #: ? ?SCHEDULE INFORMATION: ?Date: 03/06/22 ?Time: ?Location: [facility] ?

## 2022-01-23 ENCOUNTER — Telehealth: Payer: Self-pay

## 2022-01-23 ENCOUNTER — Other Ambulatory Visit: Payer: Self-pay

## 2022-01-23 NOTE — Telephone Encounter (Signed)
If having symptoms of reflux despite taking meds then will need EGD as well - if no symptoms then not needed

## 2022-01-23 NOTE — Telephone Encounter (Signed)
Informed patient that Dr. Vicente Males  advised If having symptoms of reflux despite taking meds then will need EGD as well - if no symptoms then not needed.  She said she is still having reflux despite taking '40mg'$  of protonix daily. ? ?Will contact Trish in Endo to have an EGD added to 03/06/22 Colonoscopy. ?

## 2022-01-24 ENCOUNTER — Other Ambulatory Visit: Payer: Self-pay

## 2022-01-28 ENCOUNTER — Other Ambulatory Visit: Payer: Self-pay

## 2022-01-31 ENCOUNTER — Other Ambulatory Visit: Payer: Self-pay

## 2022-02-10 ENCOUNTER — Other Ambulatory Visit: Payer: Self-pay

## 2022-02-28 ENCOUNTER — Other Ambulatory Visit: Payer: Self-pay

## 2022-03-03 ENCOUNTER — Other Ambulatory Visit: Payer: Self-pay

## 2022-03-03 DIAGNOSIS — K219 Gastro-esophageal reflux disease without esophagitis: Secondary | ICD-10-CM

## 2022-03-05 ENCOUNTER — Encounter: Payer: Self-pay | Admitting: Gastroenterology

## 2022-03-06 ENCOUNTER — Ambulatory Visit
Admission: RE | Admit: 2022-03-06 | Discharge: 2022-03-06 | Disposition: A | Discharge status: Home / Self Care | Payer: 59 | Attending: Gastroenterology | Admitting: Gastroenterology

## 2022-03-06 ENCOUNTER — Encounter
Admission: RE | Disposition: A | Discharge status: Home / Self Care | Payer: Self-pay | Source: Home / Self Care | Attending: Gastroenterology

## 2022-03-06 ENCOUNTER — Other Ambulatory Visit: Payer: Self-pay

## 2022-03-06 ENCOUNTER — Ambulatory Visit: Payer: 59 | Admitting: Anesthesiology

## 2022-03-06 DIAGNOSIS — E119 Type 2 diabetes mellitus without complications: Secondary | ICD-10-CM | POA: Diagnosis not present

## 2022-03-06 DIAGNOSIS — K219 Gastro-esophageal reflux disease without esophagitis: Secondary | ICD-10-CM | POA: Insufficient documentation

## 2022-03-06 DIAGNOSIS — Z1211 Encounter for screening for malignant neoplasm of colon: Secondary | ICD-10-CM | POA: Insufficient documentation

## 2022-03-06 DIAGNOSIS — I10 Essential (primary) hypertension: Secondary | ICD-10-CM | POA: Insufficient documentation

## 2022-03-06 DIAGNOSIS — D125 Benign neoplasm of sigmoid colon: Secondary | ICD-10-CM | POA: Insufficient documentation

## 2022-03-06 DIAGNOSIS — K635 Polyp of colon: Secondary | ICD-10-CM | POA: Diagnosis not present

## 2022-03-06 DIAGNOSIS — D122 Benign neoplasm of ascending colon: Secondary | ICD-10-CM

## 2022-03-06 DIAGNOSIS — E669 Obesity, unspecified: Secondary | ICD-10-CM | POA: Diagnosis not present

## 2022-03-06 HISTORY — PX: ESOPHAGOGASTRODUODENOSCOPY: SHX5428

## 2022-03-06 HISTORY — PX: COLONOSCOPY WITH PROPOFOL: SHX5780

## 2022-03-06 LAB — POCT PREGNANCY, URINE: Preg Test, Ur: NEGATIVE

## 2022-03-06 SURGERY — COLONOSCOPY WITH PROPOFOL
Anesthesia: General

## 2022-03-06 MED ORDER — PROPOFOL 10 MG/ML IV BOLUS
INTRAVENOUS | Status: DC | PRN
  Administered 2022-03-06: 50 mg via INTRAVENOUS
  Administered 2022-03-06: 40 mg via INTRAVENOUS

## 2022-03-06 MED ORDER — SODIUM CHLORIDE 0.9 % IV SOLN: INTRAVENOUS | Status: DC

## 2022-03-06 MED ORDER — PROPOFOL 1000 MG/100ML IV EMUL
INTRAVENOUS | Status: AC
  Filled 2022-03-06: qty 100

## 2022-03-06 MED ORDER — STERILE WATER FOR IRRIGATION IR SOLN
Status: DC | PRN
  Administered 2022-03-06 (×2): 60 mL

## 2022-03-06 MED ORDER — LIDOCAINE HCL (CARDIAC) PF 100 MG/5ML IV SOSY
PREFILLED_SYRINGE | INTRAVENOUS | Status: DC | PRN
  Administered 2022-03-06: 40 mg via INTRAVENOUS

## 2022-03-06 MED ORDER — EPHEDRINE 5 MG/ML INJ
INTRAVENOUS | Status: AC
  Filled 2022-03-06: qty 5

## 2022-03-06 MED ORDER — PROPOFOL 500 MG/50ML IV EMUL
INTRAVENOUS | Status: DC | PRN
  Administered 2022-03-06: 125 ug/kg/min via INTRAVENOUS

## 2022-03-06 NOTE — H&P (Signed)
Donna Bellows, MD [address] [place], [place], [place], [address] [id number] [place], [place], [place], [address] Phone: [phone]  Fax: [phone]  Primary Care Physician:  Tracie Harrier, MD   Pre-Procedure History & Physical: HPI:  Donna Fowler is a 46 y.o. female is here for an endoscopy and colonoscopy    Past Medical History:  Diagnosis Date   Anxiety    Arthritis    Carpal tunnel syndrome on both sides    Depression    Endocervical polyp    Gestational diabetes    Gestational hypertension    Headache    Infertility, female    Newborn product of in vitro fertilization (IVF) pregnancy     Past Surgical History:  Procedure Laterality Date   CESAREAN SECTION N/A 04/26/2020   Procedure: CESAREAN SECTION;  Surgeon: Tyson Dense, MD;  Location: [facility] LD ORS;  Service: Obstetrics;  Laterality: N/A;   COLONOSCOPY WITH ESOPHAGOGASTRODUODENOSCOPY (EGD)  2017   DILATATION & CURETTAGE/HYSTEROSCOPY WITH MYOSURE N/A 06/18/2018   Procedure: DILATATION & CURETTAGE/HYSTEROSCOPY WITH MYOSURE;  Surgeon: Linda Hedges, DO;  Location: [facility];  Service: Gynecology;  Laterality: N/A;   ELBOW ARTHROSCOPY Left 2014   excision of lymph node     axillary    LAPAROSCOPIC CHOLECYSTECTOMY  2009   PILONIDAL CYST EXCISION      Prior to Admission medications   Medication Sig Start Date End Date Taking? Authorizing Provider  aspirin EC 81 MG tablet Take 81 mg by mouth daily. Swallow whole.   Yes [provider]  busPIRone (BUSPAR) 15 MG tablet Take 1 tablet (15 mg total) by mouth 2 (two) times daily 11/27/21  Yes   busPIRone (BUSPAR) 15 MG tablet Take 1 tablet (15 mg total) by mouth 2 (two) times daily 01/22/22  Yes   cetirizine (ZYRTEC) 10 MG tablet Take 10 mg by mouth at bedtime.    Yes [provider]  ergocalciferol (VITAMIN D2) 1.25 MG (50000 UT) capsule Take one capsule by mouth once a week 01/22/22  Yes   pantoprazole (PROTONIX) 40  MG tablet Take 1 tablet by mouth once daily 07/17/21  Yes   acetaminophen (TYLENOL) 650 MG CR tablet Take 1,300 mg by mouth every 8 (eight) hours as needed for pain.     [provider]  albuterol (PROVENTIL HFA;VENTOLIN HFA) 108 (90 Base) MCG/ACT inhaler Inhale 2 puffs into the lungs every 6 (six) hours as needed for wheezing or shortness of breath. Patient not taking: Reported on 02/13/2020 11/08/18   Tenna Delaine D, PA-C  albuterol (VENTOLIN HFA) 108 (90 Base) MCG/ACT inhaler INHALE 2 PUFFS BY MOUTH EVERY 6 HOURS AS NEEDED FOR WHEEZING 07/18/20 08/03/21  Hande, Cherlyn Labella, MD  albuterol (VENTOLIN HFA) 108 (90 Base) MCG/ACT inhaler INHALE 2 PUFFS BY MOUTH EVERY 6 HOURS AS NEEDED FOR WHEEZING 09/02/21     ALPRAZolam (XANAX) 0.25 MG tablet Take 1 tablet (0.25 mg total) by mouth once daily as needed for Sleep for up to 30 days 07/17/21     amoxicillin-clavulanate (AUGMENTIN) 875-125 MG tablet One po BID x 10 days 02/12/21     atorvastatin (LIPITOR) 10 MG tablet Take 1 tablet (10 mg total) by mouth once daily 01/22/22     busPIRone (BUSPAR) 15 MG tablet Take 15 mg by mouth every evening.  10/05/17   [provider]  cephALEXin (KEFLEX) 500 MG capsule Take 1 capsule (500 mg total) by mouth 3 (three) times daily for  10 days 07/17/21     chlorpheniramine-HYDROcodone (TUSSIONEX) 10-8 MG/5ML SUER Take 5 mLs by mouth at bedtime as needed for up to 10 days 07/17/21     ciprofloxacin-dexamethasone (CIPRODEX) OTIC suspension Location: Right Ear. 4 drops right ear BID x 4 days 06/18/21     famotidine (PEPCID) 20 MG tablet Take 1 tablet (20 mg total) by mouth nightly as needed for Heartburn 01/17/21     fluticasone (FLONASE) 50 MCG/ACT nasal spray PLACE 2 SPRAYS INTO BOTH NOSTRILS ONCE DAILY 07/18/20 08/08/21  Tracie Harrier, MD  fluticasone (FLONASE) 50 MCG/ACT nasal spray Place 2 sprays into both nostrils once daily 01/17/21     fluticasone (FLONASE) 50 MCG/ACT nasal spray PLACE 2 SPRAYS INTO  BOTH NOSTRILS ONCE DAILY 09/02/21     fluticasone (FLONASE) 50 MCG/ACT nasal spray Place 2 sprays into both nostrils once daily 01/22/22     ibuprofen (ADVIL) 600 MG tablet Take 1 tablet (600 mg total) by mouth every 6 (six) hours as needed. 04/29/20   Everlene Farrier, MD  predniSONE (DELTASONE) 10 MG tablet 3,3,2,2,1,1 and stop 07/17/21     Prenatal Vit-Fe Fumarate-FA (PRENATAL MULTIVITAMIN) TABS tablet Take 1 tablet by mouth daily at 12 noon.    [provider]  Semaglutide,0.25 or 0.'5MG'$ /DOS, (OZEMPIC, 0.25 OR 0.5 MG/DOSE,) 2 MG/3ML SOPN Inject 0.25 mg subcutaneously once a week 01/22/22     sertraline (ZOLOFT) 100 MG tablet Take one tablet by mouth daily 01/22/22     sertraline (ZOLOFT) 50 MG tablet Take 50 mg by mouth at bedtime.  10/05/17   [provider]  sertraline (ZOLOFT) 50 MG tablet TAKE 1 TABLET (50 MG TOTAL) BY MOUTH ONCE DAILY 11/27/20 11/27/21  Tracie Harrier, MD  sertraline (ZOLOFT) 50 MG tablet TAKE 1 TABLET BY MOUTH ONCE DAILY 07/18/20 07/18/21  Hande, Cherlyn Labella, MD  sertraline (ZOLOFT) 50 MG tablet TAKE 1 TABLET (50 MG TOTAL) BY MOUTH ONCE DAILY 01/17/20 01/16/21  Tracie Harrier, MD  sertraline (ZOLOFT) 50 MG tablet Take 1 tablet (50 mg total) by mouth once daily 01/17/21     tranexamic acid (LYSTEDA) 650 MG TABS tablet Take 2 tablets 3 times a day by oral route. 06/27/21     Vitamin D, Ergocalciferol, (DRISDOL) 1.25 MG (50000 UNIT) CAPS capsule Take 1 capsule by mouth once weekly 07/17/21       Allergies as of 01/23/2022   (No Known Allergies)    Family History  Problem Relation Age of Onset   Hypercholesterolemia Father    Hypertension Father    Heart disease Mother        CABG   Diabetes Mother    Heart disease Maternal Grandfather        CABG    Social History   Socioeconomic History   Marital status: Married    Spouse name: Not on file   Number of children: Not on file   Years of education: Not on file   Highest education level: Not on file   Occupational History   Not on file  Tobacco Use   Smoking status: Never   Smokeless tobacco: Never  Vaping Use   Vaping Use: Never used  Substance and Sexual Activity   Alcohol use: Not Currently    Alcohol/week: 0.0 standard drinks of alcohol    Comment: social   Drug use: Never   Sexual activity: Not on file  Other Topics Concern   Not on file  Social History Narrative   Not on file   Social  Determinants of Health   Financial Resource Strain: Not on file  Food Insecurity: Not on file  Transportation Needs: Not on file  Physical Activity: Not on file  Stress: Not on file  Social Connections: Not on file  Intimate Partner Violence: Not on file    Review of Systems: See HPI, otherwise negative ROS  Physical Exam: BP 137/83   Pulse 88   Temp (!) 97.5 F (36.4 C) (Temporal)   Resp 20   Ht '5\' 3"'$  (1.6 m)   Wt 87.1 kg   SpO2 100%   BMI 34.01 kg/m  General:   Alert,  pleasant and cooperative in NAD Head:  Normocephalic and atraumatic. Neck:  Supple; no masses or thyromegaly. Lungs:  Clear throughout to auscultation, normal respiratory effort.    Heart:  +S1, +S2, Regular rate and rhythm, No edema. Abdomen:  Soft, nontender and nondistended. Normal bowel sounds, without guarding, and without rebound.   Neurologic:  Alert and  oriented x4;  grossly normal neurologically.  Impression/Plan: Donna Fowler is here for an endoscopy and colonoscopy  to be performed for  evaluation of GERD and colon cancer screening     Risks, benefits, limitations, and alternatives regarding endoscopy have been reviewed with the patient.  Questions have been answered.  All parties agreeable.   Donna Bellows, MD  03/06/2022, 8:18 AM

## 2022-03-06 NOTE — Anesthesia Preprocedure Evaluation (Addendum)
Anesthesia Evaluation  Patient identified by MRN, date of birth, ID band Patient awake    Reviewed: Allergy & Precautions, NPO status , Patient's Chart, lab work & pertinent test results  History of Anesthesia Complications Negative for: history of anesthetic complications  Airway Mallampati: I   Neck ROM: Full    Dental no notable dental hx.    Pulmonary neg pulmonary ROS,    Pulmonary exam normal breath sounds clear to auscultation       Cardiovascular hypertension, Normal cardiovascular exam Rhythm:Regular Rate:Normal     Neuro/Psych PSYCHIATRIC DISORDERS Anxiety Depression negative neurological ROS     GI/Hepatic GERD  ,  Endo/Other  diabetes, Type 2Obesity   Renal/GU negative Renal ROS     Musculoskeletal  (+) Arthritis ,   Abdominal   Peds  Hematology negative hematology ROS (+)   Anesthesia Other Findings   Reproductive/Obstetrics                            Anesthesia Physical Anesthesia Plan  ASA: 2  Anesthesia Plan: General   Post-op Pain Management:    Induction: Intravenous  PONV Risk Score and Plan: 3 and Propofol infusion, TIVA and Treatment may vary due to age or medical condition  Airway Management Planned: Natural Airway  Additional Equipment:   Intra-op Plan:   Post-operative Plan:   Informed Consent: I have reviewed the patients History and Physical, chart, labs and discussed the procedure including the risks, benefits and alternatives for the proposed anesthesia with the patient or authorized representative who has indicated his/her understanding and acceptance.       Plan Discussed with: CRNA  Anesthesia Plan Comments: (LMA/GETA backup discussed.  Patient consented for risks of anesthesia including but not limited to:  - adverse reactions to medications - damage to eyes, teeth, lips or other oral mucosa - nerve damage due to positioning  - sore  throat or hoarseness - damage to heart, brain, nerves, lungs, other parts of body or loss of life  Informed patient about role of CRNA in peri- and intra-operative care.  Patient voiced understanding.)        Anesthesia Quick Evaluation

## 2022-03-06 NOTE — Op Note (Signed)
[facility] Patient Name: Donna Fowler Procedure Date: 03/06/2022 8:24 AM MRN: [id number] Account #: [id number] Date of Birth: [birth year] Admit Type: Outpatient Age: 46 Room: [facility] ENDO ROOM 3 Gender: Female Note Status: Finalized Instrument Name: Park Meo [id number] Procedure:             Colonoscopy Indications:           Screening for colorectal malignant neoplasm Providers:             [name] MD, MD Referring MD:          Tracie Harrier, MD (Referring MD) Medicines:             Monitored Anesthesia Care Complications:         No immediate complications. Procedure:             Pre-Anesthesia Assessment:                        - Prior to the procedure, a History and Physical was                         performed, and patient medications, allergies and                         sensitivities were reviewed. The patient's tolerance                         of previous anesthesia was reviewed.                        - The risks and benefits of the procedure and the                         sedation options and risks were discussed with the                         patient. All questions were answered and informed                         consent was obtained.                        - ASA Grade Assessment: II - A patient with mild                         systemic disease.                        After obtaining informed consent, the colonoscope was                         passed under direct vision. Throughout the procedure,                         the patient's blood pressure, pulse, and oxygen                         saturations were monitored continuously. The                         Colonoscope was introduced  through the anus and                         advanced to the the cecum, identified by the                         appendiceal orifice. The colonoscopy was performed                         without difficulty. The patient tolerated the                          procedure well. The quality of the bowel preparation                         was good. Findings:      The perianal and digital rectal examinations were normal.      Two sessile polyps were found in the sigmoid colon and ascending colon.       The polyps were 3 to 4 mm in size. These polyps were removed with a cold       biopsy forceps. Resection and retrieval were complete.      The exam was otherwise without abnormality on direct and retroflexion       views. Impression:            - Two 3 to 4 mm polyps in the sigmoid colon and in the                         ascending colon, removed with a cold biopsy forceps.                         Resected and retrieved.                        - The examination was otherwise normal on direct and                         retroflexion views. Recommendation:        - Discharge patient to home (with escort).                        - Resume previous diet.                        - Continue present medications.                        - Await pathology results.                        - Repeat colonoscopy for surveillance based on                         pathology results. Procedure Code(s):     --- Professional ---                        [phone], Colonoscopy, flexible; with biopsy, single or  multiple Diagnosis Code(s):     --- Professional ---                        Z12.11, Encounter for screening for malignant neoplasm                         of colon                        K63.5, Polyp of colon CPT copyright 2019 American Medical Association. All rights reserved. The codes documented in this report are preliminary and upon coder review may  be revised to meet current compliance requirements. [name], MD [name] MD, MD 03/06/2022 9:02:04 AM This report has been signed electronically. Number of Addenda: 0 Note Initiated On: 03/06/2022 8:24 AM Scope Withdrawal Time: 0 hours 12 minutes 22 seconds  Total  Procedure Duration: 0 hours 18 minutes 48 seconds  Estimated Blood Loss:  Estimated blood loss: none.      [facility]

## 2022-03-06 NOTE — Op Note (Signed)
[facility] Gastroenterology Patient Name: Donna Fowler Procedure Date: 03/06/2022 8:25 AM MRN: [id number] Account #: [id number] Date of Birth: 1976/05/16 Admit Type: Outpatient Age: 46 Room: [facility] ENDO ROOM 3 Gender: Female Note Status: Finalized Instrument Name: Michaelle Birks [id number] Procedure:             Upper GI endoscopy Indications:           Follow-up of esophageal reflux Providers:             [name] MD, MD Referring MD:          Tracie Harrier, MD (Referring MD) Medicines:             Monitored Anesthesia Care Complications:         No immediate complications. Procedure:             Pre-Anesthesia Assessment:                        - Prior to the procedure, a History and Physical was                         performed, and patient medications, allergies and                         sensitivities were reviewed. The patient's tolerance                         of previous anesthesia was reviewed.                        - The risks and benefits of the procedure and the                         sedation options and risks were discussed with the                         patient. All questions were answered and informed                         consent was obtained.                        - ASA Grade Assessment: II - A patient with mild                         systemic disease.                        After obtaining informed consent, the endoscope was                         passed under direct vision. Throughout the procedure,                         the patient's blood pressure, pulse, and oxygen                         saturations were monitored continuously. The Endoscope                         was  introduced through the mouth, and advanced to the                         third part of duodenum. The upper GI endoscopy was                         accomplished with ease. The patient tolerated the                         procedure well. Findings:       The esophagus was normal.      The stomach was normal.      The examined duodenum was normal. Impression:            - Normal esophagus.                        - Normal stomach.                        - Normal examined duodenum.                        - No specimens collected. Recommendation:        - Perform a colonoscopy today. Procedure Code(s):     --- Professional ---                        [phone], Esophagogastroduodenoscopy, flexible,                         transoral; diagnostic, including collection of                         specimen(s) by brushing or washing, when performed                         (separate procedure) Diagnosis Code(s):     --- Professional ---                        K21.9, Gastro-esophageal reflux disease without                         esophagitis CPT copyright 2019 American Medical Association. All rights reserved. The codes documented in this report are preliminary and upon coder review may  be revised to meet current compliance requirements. [name], MD [name] MD, MD 03/06/2022 8:38:14 AM This report has been signed electronically. Number of Addenda: 0 Note Initiated On: 03/06/2022 8:25 AM Estimated Blood Loss:  Estimated blood loss: none.      [facility]

## 2022-03-06 NOTE — Transfer of Care (Signed)
Immediate Anesthesia Transfer of Care Note  Patient: Donna Fowler  Procedure(s) Performed: COLONOSCOPY WITH PROPOFOL ESOPHAGOGASTRODUODENOSCOPY (EGD)  Patient Location: Endoscopy Unit  Anesthesia Type:General  Level of Consciousness: drowsy  Airway & Oxygen Therapy: Patient Spontanous Breathing  Post-op Assessment: Report given to RN and Post -op Vital signs reviewed and stable  Post vital signs: Reviewed and stable  Last Vitals:  Vitals Value Taken Time  BP 101/67 03/06/22 0904  Temp 36.3 C 03/06/22 0904  Pulse 72 03/06/22 0904  Resp 23 03/06/22 0904  SpO2 100 % 03/06/22 0904  Vitals shown include unvalidated device data.  Last Pain:  Vitals:   03/06/22 0904  TempSrc: Temporal  PainSc: Asleep         Complications: No notable events documented.

## 2022-03-07 NOTE — Anesthesia Postprocedure Evaluation (Signed)
Anesthesia Post Note  Patient: Donna Fowler  Procedure(s) Performed: COLONOSCOPY WITH PROPOFOL ESOPHAGOGASTRODUODENOSCOPY (EGD)  Patient location during evaluation: PACU Anesthesia Type: General Level of consciousness: awake and alert, oriented and patient cooperative Pain management: pain level controlled Vital Signs Assessment: post-procedure vital signs reviewed and stable Respiratory status: spontaneous breathing, nonlabored ventilation and respiratory function stable Cardiovascular status: blood pressure returned to baseline and stable Postop Assessment: adequate PO intake Anesthetic complications: no   No notable events documented.   Last Vitals:  Vitals:   03/06/22 0914 03/06/22 0924  BP: (!) 98/56 129/83  Pulse:    Resp:    Temp:    SpO2:      Last Pain:  Vitals:   03/07/22 0742  TempSrc:   PainSc: 0-No pain                 Darrin Nipper

## 2022-03-10 ENCOUNTER — Encounter: Payer: Self-pay | Admitting: Gastroenterology

## 2022-03-10 LAB — SURGICAL PATHOLOGY

## 2022-03-20 ENCOUNTER — Ambulatory Visit
Admission: RE | Admit: 2022-03-20 | Discharge: 2022-03-20 | Disposition: A | Discharge status: Home / Self Care | Payer: 59 | Source: Ambulatory Visit | Attending: Internal Medicine | Admitting: Internal Medicine

## 2022-03-20 DIAGNOSIS — Z1231 Encounter for screening mammogram for malignant neoplasm of breast: Secondary | ICD-10-CM | POA: Insufficient documentation

## 2022-04-01 ENCOUNTER — Other Ambulatory Visit: Payer: Self-pay

## 2022-04-03 ENCOUNTER — Other Ambulatory Visit: Payer: Self-pay

## 2022-04-03 DIAGNOSIS — I208 Other forms of angina pectoris: Secondary | ICD-10-CM | POA: Diagnosis not present

## 2022-04-03 DIAGNOSIS — I10 Essential (primary) hypertension: Secondary | ICD-10-CM | POA: Diagnosis not present

## 2022-04-03 DIAGNOSIS — R079 Chest pain, unspecified: Secondary | ICD-10-CM | POA: Diagnosis not present

## 2022-04-03 DIAGNOSIS — Z6834 Body mass index (BMI) 34.0-34.9, adult: Secondary | ICD-10-CM | POA: Diagnosis not present

## 2022-04-03 DIAGNOSIS — E7800 Pure hypercholesterolemia, unspecified: Secondary | ICD-10-CM | POA: Diagnosis not present

## 2022-04-03 MED ORDER — ATORVASTATIN CALCIUM 40 MG PO TABS
ORAL_TABLET | ORAL | 3 refills | Status: DC
  Filled 2022-04-03: qty 90, 90d supply, fill #0
  Filled 2022-06-22: qty 90, 90d supply, fill #1

## 2022-04-07 ENCOUNTER — Other Ambulatory Visit: Payer: Self-pay

## 2022-04-17 DIAGNOSIS — I208 Other forms of angina pectoris: Secondary | ICD-10-CM | POA: Diagnosis not present

## 2022-04-22 ENCOUNTER — Other Ambulatory Visit: Payer: Self-pay

## 2022-05-13 ENCOUNTER — Other Ambulatory Visit: Payer: Self-pay

## 2022-06-04 DIAGNOSIS — R0789 Other chest pain: Secondary | ICD-10-CM | POA: Diagnosis not present

## 2022-06-04 DIAGNOSIS — Z6834 Body mass index (BMI) 34.0-34.9, adult: Secondary | ICD-10-CM | POA: Diagnosis not present

## 2022-06-04 DIAGNOSIS — E1165 Type 2 diabetes mellitus with hyperglycemia: Secondary | ICD-10-CM | POA: Diagnosis not present

## 2022-06-04 DIAGNOSIS — E7800 Pure hypercholesterolemia, unspecified: Secondary | ICD-10-CM | POA: Diagnosis not present

## 2022-06-04 DIAGNOSIS — Z0000 Encounter for general adult medical examination without abnormal findings: Secondary | ICD-10-CM | POA: Diagnosis not present

## 2022-06-23 ENCOUNTER — Other Ambulatory Visit: Payer: Self-pay

## 2022-07-03 ENCOUNTER — Other Ambulatory Visit: Payer: Self-pay

## 2022-07-03 DIAGNOSIS — Z01419 Encounter for gynecological examination (general) (routine) without abnormal findings: Secondary | ICD-10-CM | POA: Diagnosis not present

## 2022-07-03 DIAGNOSIS — Z1331 Encounter for screening for depression: Secondary | ICD-10-CM | POA: Diagnosis not present

## 2022-07-03 DIAGNOSIS — I10 Essential (primary) hypertension: Secondary | ICD-10-CM | POA: Diagnosis not present

## 2022-07-03 DIAGNOSIS — Z1151 Encounter for screening for human papillomavirus (HPV): Secondary | ICD-10-CM | POA: Diagnosis not present

## 2022-07-03 DIAGNOSIS — R0602 Shortness of breath: Secondary | ICD-10-CM | POA: Diagnosis not present

## 2022-07-03 DIAGNOSIS — Z6835 Body mass index (BMI) 35.0-35.9, adult: Secondary | ICD-10-CM | POA: Diagnosis not present

## 2022-07-03 DIAGNOSIS — J4520 Mild intermittent asthma, uncomplicated: Secondary | ICD-10-CM | POA: Diagnosis not present

## 2022-07-03 DIAGNOSIS — R7309 Other abnormal glucose: Secondary | ICD-10-CM | POA: Diagnosis not present

## 2022-07-03 DIAGNOSIS — Z6836 Body mass index (BMI) 36.0-36.9, adult: Secondary | ICD-10-CM | POA: Diagnosis not present

## 2022-07-03 DIAGNOSIS — E7800 Pure hypercholesterolemia, unspecified: Secondary | ICD-10-CM | POA: Diagnosis not present

## 2022-07-03 DIAGNOSIS — Z124 Encounter for screening for malignant neoplasm of cervix: Secondary | ICD-10-CM | POA: Diagnosis not present

## 2022-07-03 MED ORDER — ALPRAZOLAM 0.25 MG PO TABS
ORAL_TABLET | ORAL | 3 refills | Status: DC
  Filled 2022-07-03: qty 30, 30d supply, fill #0
  Filled 2022-09-14: qty 30, 30d supply, fill #1

## 2022-07-03 MED ORDER — BUSPIRONE HCL 15 MG PO TABS: ORAL_TABLET | ORAL | 1 refills | Status: DC

## 2022-07-03 MED ORDER — ALBUTEROL SULFATE HFA 108 (90 BASE) MCG/ACT IN AERS
INHALATION_SPRAY | RESPIRATORY_TRACT | 2 refills | Status: AC
  Filled 2022-07-03: qty 6.7, 25d supply, fill #0

## 2022-07-03 MED ORDER — VITAMIN D (ERGOCALCIFEROL) 1.25 MG (50000 UNIT) PO CAPS
ORAL_CAPSULE | ORAL | 1 refills | Status: DC
  Filled 2022-07-03 – 2022-08-15 (×2): qty 12, 84d supply, fill #0
  Filled 2023-01-03: qty 12, 84d supply, fill #1

## 2022-07-03 MED ORDER — QSYMIA 3.75-23 MG PO CP24
ORAL_CAPSULE | ORAL | 0 refills | Status: DC
  Filled 2022-07-03 (×2): qty 30, 30d supply, fill #0

## 2022-07-03 MED ORDER — SERTRALINE HCL 100 MG PO TABS
100.0000 mg | ORAL_TABLET | Freq: Every day | ORAL | 1 refills | Status: DC
  Filled 2022-07-03 – 2022-10-06 (×2): qty 90, 90d supply, fill #0
  Filled 2023-01-15: qty 90, 90d supply, fill #1

## 2022-07-04 ENCOUNTER — Other Ambulatory Visit: Payer: Self-pay

## 2022-07-09 ENCOUNTER — Other Ambulatory Visit: Payer: Self-pay

## 2022-07-09 MED ORDER — SAXENDA 18 MG/3ML ~~LOC~~ SOPN
PEN_INJECTOR | SUBCUTANEOUS | 0 refills | Status: DC
  Filled 2022-07-09 – 2022-07-14 (×2): qty 15, 30d supply, fill #0

## 2022-07-11 ENCOUNTER — Other Ambulatory Visit: Payer: Self-pay

## 2022-07-14 ENCOUNTER — Other Ambulatory Visit: Payer: Self-pay

## 2022-07-18 ENCOUNTER — Other Ambulatory Visit: Payer: Self-pay

## 2022-07-18 MED ORDER — MOUNJARO 2.5 MG/0.5ML ~~LOC~~ SOAJ
SUBCUTANEOUS | 3 refills | Status: DC
  Filled 2022-07-18: qty 2, 28d supply, fill #0
  Filled 2022-08-15: qty 2, 28d supply, fill #1
  Filled 2022-09-14: qty 2, 28d supply, fill #2
  Filled 2022-10-06: qty 2, 28d supply, fill #3

## 2022-07-21 ENCOUNTER — Other Ambulatory Visit: Payer: Self-pay

## 2022-07-22 ENCOUNTER — Other Ambulatory Visit: Payer: Self-pay

## 2022-07-29 ENCOUNTER — Other Ambulatory Visit: Payer: Self-pay

## 2022-08-15 ENCOUNTER — Other Ambulatory Visit: Payer: Self-pay

## 2022-08-18 ENCOUNTER — Other Ambulatory Visit: Payer: Self-pay

## 2022-09-02 ENCOUNTER — Other Ambulatory Visit: Payer: Self-pay

## 2022-09-16 ENCOUNTER — Other Ambulatory Visit: Payer: Self-pay

## 2022-09-17 ENCOUNTER — Other Ambulatory Visit: Payer: Self-pay

## 2022-10-06 ENCOUNTER — Other Ambulatory Visit: Payer: Self-pay

## 2022-10-07 ENCOUNTER — Other Ambulatory Visit: Payer: Self-pay

## 2022-10-09 ENCOUNTER — Other Ambulatory Visit: Payer: Self-pay

## 2022-10-21 ENCOUNTER — Other Ambulatory Visit: Payer: Self-pay

## 2022-10-21 MED ORDER — MOUNJARO 2.5 MG/0.5ML ~~LOC~~ SOAJ
2.5000 mg | SUBCUTANEOUS | 3 refills | Status: DC
  Filled 2022-10-21 – 2022-11-02 (×2): qty 2, 28d supply, fill #0
  Filled 2022-12-02: qty 2, 28d supply, fill #1
  Filled 2023-01-03: qty 2, 28d supply, fill #2

## 2022-10-21 MED ORDER — PANTOPRAZOLE SODIUM 40 MG PO TBEC
40.0000 mg | DELAYED_RELEASE_TABLET | Freq: Every day | ORAL | 3 refills | Status: DC
  Filled 2022-10-21: qty 90, 90d supply, fill #0
  Filled 2023-03-10: qty 90, 90d supply, fill #1
  Filled 2023-06-03: qty 60, 60d supply, fill #2

## 2022-10-22 ENCOUNTER — Other Ambulatory Visit: Payer: Self-pay

## 2022-11-02 ENCOUNTER — Other Ambulatory Visit: Payer: Self-pay

## 2022-11-04 DIAGNOSIS — R200 Anesthesia of skin: Secondary | ICD-10-CM | POA: Diagnosis not present

## 2022-11-04 DIAGNOSIS — R202 Paresthesia of skin: Secondary | ICD-10-CM | POA: Diagnosis not present

## 2022-11-21 ENCOUNTER — Other Ambulatory Visit: Payer: Self-pay

## 2022-11-21 MED ORDER — GABAPENTIN 300 MG PO CAPS
300.0000 mg | ORAL_CAPSULE | Freq: Every day | ORAL | 3 refills | Status: DC
  Filled 2022-11-21: qty 30, 30d supply, fill #0

## 2022-12-02 ENCOUNTER — Other Ambulatory Visit: Payer: Self-pay

## 2022-12-03 DIAGNOSIS — R200 Anesthesia of skin: Secondary | ICD-10-CM | POA: Diagnosis not present

## 2022-12-03 DIAGNOSIS — R202 Paresthesia of skin: Secondary | ICD-10-CM | POA: Diagnosis not present

## 2023-01-03 ENCOUNTER — Other Ambulatory Visit: Payer: Self-pay

## 2023-01-04 ENCOUNTER — Other Ambulatory Visit: Payer: Self-pay

## 2023-01-05 ENCOUNTER — Other Ambulatory Visit: Payer: Self-pay

## 2023-01-05 MED ORDER — ALPRAZOLAM 0.25 MG PO TABS
0.2500 mg | ORAL_TABLET | Freq: Every day | ORAL | 3 refills | Status: AC | PRN
  Filled 2023-01-05: qty 30, 30d supply, fill #0

## 2023-01-08 ENCOUNTER — Other Ambulatory Visit: Payer: Self-pay

## 2023-01-22 DIAGNOSIS — J4520 Mild intermittent asthma, uncomplicated: Secondary | ICD-10-CM | POA: Diagnosis not present

## 2023-01-22 DIAGNOSIS — Z6835 Body mass index (BMI) 35.0-35.9, adult: Secondary | ICD-10-CM | POA: Diagnosis not present

## 2023-01-22 DIAGNOSIS — R0602 Shortness of breath: Secondary | ICD-10-CM | POA: Diagnosis not present

## 2023-01-22 DIAGNOSIS — I10 Essential (primary) hypertension: Secondary | ICD-10-CM | POA: Diagnosis not present

## 2023-01-22 DIAGNOSIS — E7800 Pure hypercholesterolemia, unspecified: Secondary | ICD-10-CM | POA: Diagnosis not present

## 2023-01-22 DIAGNOSIS — R7309 Other abnormal glucose: Secondary | ICD-10-CM | POA: Diagnosis not present

## 2023-01-29 ENCOUNTER — Other Ambulatory Visit: Payer: Self-pay

## 2023-01-29 DIAGNOSIS — E1165 Type 2 diabetes mellitus with hyperglycemia: Secondary | ICD-10-CM | POA: Diagnosis not present

## 2023-01-29 DIAGNOSIS — R7309 Other abnormal glucose: Secondary | ICD-10-CM | POA: Diagnosis not present

## 2023-01-29 DIAGNOSIS — Z0000 Encounter for general adult medical examination without abnormal findings: Secondary | ICD-10-CM | POA: Diagnosis not present

## 2023-01-29 DIAGNOSIS — J302 Other seasonal allergic rhinitis: Secondary | ICD-10-CM | POA: Diagnosis not present

## 2023-01-29 DIAGNOSIS — I10 Essential (primary) hypertension: Secondary | ICD-10-CM | POA: Diagnosis not present

## 2023-01-29 DIAGNOSIS — K219 Gastro-esophageal reflux disease without esophagitis: Secondary | ICD-10-CM | POA: Diagnosis not present

## 2023-01-29 MED ORDER — BUSPIRONE HCL 15 MG PO TABS
15.0000 mg | ORAL_TABLET | Freq: Two times a day (BID) | ORAL | 1 refills | Status: AC
  Filled 2023-01-29: qty 30, 15d supply, fill #0
  Filled 2023-01-29: qty 150, 75d supply, fill #0
  Filled 2023-11-08: qty 180, 90d supply, fill #0

## 2023-01-29 MED ORDER — ATORVASTATIN CALCIUM 40 MG PO TABS
40.0000 mg | ORAL_TABLET | Freq: Every day | ORAL | 1 refills | Status: DC
  Filled 2023-01-29: qty 90, 90d supply, fill #0

## 2023-01-29 MED ORDER — MOUNJARO 5 MG/0.5ML ~~LOC~~ SOAJ
5.0000 mg | SUBCUTANEOUS | 3 refills | Status: DC
  Filled 2023-01-29: qty 2, 28d supply, fill #0
  Filled 2023-03-10: qty 2, 28d supply, fill #1
  Filled 2023-04-23: qty 2, 28d supply, fill #2
  Filled 2023-06-03: qty 2, 28d supply, fill #3

## 2023-01-29 MED ORDER — SERTRALINE HCL 100 MG PO TABS
100.0000 mg | ORAL_TABLET | Freq: Every day | ORAL | 1 refills | Status: AC
  Filled 2023-01-29 – 2023-05-25 (×2): qty 90, 90d supply, fill #0
  Filled 2023-08-23: qty 90, 90d supply, fill #1

## 2023-02-04 ENCOUNTER — Other Ambulatory Visit: Payer: Self-pay

## 2023-03-10 ENCOUNTER — Other Ambulatory Visit: Payer: Self-pay

## 2023-03-23 ENCOUNTER — Other Ambulatory Visit: Payer: Self-pay

## 2023-03-23 ENCOUNTER — Other Ambulatory Visit: Payer: Self-pay | Admitting: Internal Medicine

## 2023-03-23 DIAGNOSIS — Z1231 Encounter for screening mammogram for malignant neoplasm of breast: Secondary | ICD-10-CM

## 2023-03-23 MED ORDER — SCOPOLAMINE 1 MG/3DAYS TD PT72
1.0000 | MEDICATED_PATCH | TRANSDERMAL | 0 refills | Status: DC
  Filled 2023-03-23: qty 4, 12d supply, fill #0

## 2023-03-25 NOTE — Progress Notes (Signed)
Consulting Department:  Barbette Reichmann, MD  Primary Physician:  Barbette Reichmann, MD  Chief Complaint: Ulnar neuropathy  History of Present Illness: 03/25/2023 Donna Fowler is a 47 y.o. female who presents with the chief complaint of history of right-sided ulnar neuropathy.  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  She has been followed for ulnar neuropathy.  Has tried elbow pads and elbow braces.  She also wears a wrist brace for supposed carpal tunnel.  She gets pain shooting down the right elbow down to the medial aspect of the hand splitting the fourth digit.  She also has lost some dexterity.  Noticed some right-handed weakness.  She also has symptoms on the left but the right side is worse.  She had a previous left-sided ulnar release which gave her some immediate relief but has had some recurrence.  Conservative measures: compression sleeves, ice, elbow splints Physical therapy: has not participated Medications: tylenol, ibuprofen, gabapentin, lidocaine patches Injections: has received carpal tunnel injection in 2023 by Dr Landry Mellow - helped for about 1-2 days  The symptoms are causing a significant impact on the patient's life.   Review of Systems:  A 10 point review of systems is negative, except for the pertinent positives and negatives detailed in the HPI.  Past Medical History: Past Medical History:  Diagnosis Date   Anxiety    Arthritis    Carpal tunnel syndrome on both sides    Depression    Endocervical polyp    Gestational diabetes    Gestational hypertension    Headache    Infertility, female    Newborn product of in vitro fertilization (IVF) pregnancy     Past Surgical History: Past Surgical History:  Procedure Laterality Date   CESAREAN SECTION N/A 04/26/2020   Procedure: CESAREAN SECTION;  Surgeon: Ranae Pila, MD;  Location: [facility] LD ORS;  Service: Obstetrics;  Laterality: N/A;   COLONOSCOPY WITH ESOPHAGOGASTRODUODENOSCOPY  (EGD)  2017   COLONOSCOPY WITH PROPOFOL N/A 03/06/2022   Procedure: COLONOSCOPY WITH PROPOFOL;  Surgeon: Wyline Mood, MD;  Location: [facility] ENDOSCOPY;  Service: Gastroenterology;  Laterality: N/A;   DILATATION & CURETTAGE/HYSTEROSCOPY WITH MYOSURE N/A 06/18/2018   Procedure: DILATATION & CURETTAGE/HYSTEROSCOPY WITH MYOSURE;  Surgeon: Mitchel Honour, DO;  Location: [facility];  Service: Gynecology;  Laterality: N/A;   ELBOW ARTHROSCOPY Left 2014   ESOPHAGOGASTRODUODENOSCOPY N/A 03/06/2022   Procedure: ESOPHAGOGASTRODUODENOSCOPY (EGD);  Surgeon: Wyline Mood, MD;  Location: [facility] ENDOSCOPY;  Service: Gastroenterology;  Laterality: N/A;   excision of lymph node     axillary    LAPAROSCOPIC CHOLECYSTECTOMY  2009   PILONIDAL CYST EXCISION      Allergies: Allergies as of 03/30/2023   (No Known Allergies)    Medications:  Current Outpatient Medications:    acetaminophen (TYLENOL) 650 MG CR tablet, Take 1,300 mg by mouth every 8 (eight) hours as needed for pain. , Disp: , Rfl:    albuterol (PROVENTIL HFA;VENTOLIN HFA) 108 (90 Base) MCG/ACT inhaler, Inhale 2 puffs into the lungs every 6 (six) hours as needed for wheezing or shortness of breath. (Patient not taking: Reported on 02/13/2020), Disp: 1 Inhaler, Rfl: 0   albuterol (VENTOLIN HFA) 108 (90 Base) MCG/ACT inhaler, INHALE 2 PUFFS BY MOUTH EVERY 6 HOURS AS NEEDED FOR WHEEZING, Disp: 18 g, Rfl: 5   albuterol (VENTOLIN HFA) 108 (90 Base) MCG/ACT inhaler, INHALE 2 PUFFS BY MOUTH EVERY 6 HOURS AS NEEDED FOR WHEEZING, Disp: 18 g, Rfl: 5   albuterol (  VENTOLIN HFA) 108 (90 Base) MCG/ACT inhaler, Inhale 2 inhalations into the lungs every 6 (six) hours as needed for up to 90 days, Disp: 6.7 g, Rfl: 2   ALPRAZolam (XANAX) 0.25 MG tablet, Take 1 tablet (0.25 mg total) by mouth once daily as needed for Sleep for up to 30 days, Disp: 30 tablet, Rfl: 0   ALPRAZolam (XANAX) 0.25 MG tablet, Take 1 tablet (0.25 mg total) by mouth daily as needed for  sleep., Disp: 30 tablet, Rfl: 3   amoxicillin-clavulanate (AUGMENTIN) 875-125 MG tablet, One po BID x 10 days, Disp: 20 tablet, Rfl: 0   aspirin EC 81 MG tablet, Take 81 mg by mouth daily. Swallow whole., Disp: , Rfl:    atorvastatin (LIPITOR) 10 MG tablet, Take 1 tablet (10 mg total) by mouth once daily, Disp: 90 tablet, Rfl: 1   atorvastatin (LIPITOR) 40 MG tablet, Take 1 tablet (40 mg total) by mouth once daily, Disp: 90 tablet, Rfl: 3   atorvastatin (LIPITOR) 40 MG tablet, Take 1 tablet (40 mg total) by mouth daily., Disp: 90 tablet, Rfl: 1   busPIRone (BUSPAR) 15 MG tablet, Take 15 mg by mouth every evening. , Disp: , Rfl:    busPIRone (BUSPAR) 15 MG tablet, Take 1 tablet (15 mg total) by mouth 2 (two) times daily, Disp: 180 tablet, Rfl: 1   busPIRone (BUSPAR) 15 MG tablet, Take 1 tablet (15 mg total) by mouth 2 (two) times daily, Disp: 180 tablet, Rfl: 1   busPIRone (BUSPAR) 15 MG tablet, Take 1 tablet (15 mg total) by mouth 2 (two) times daily, Disp: 180 tablet, Rfl: 1   busPIRone (BUSPAR) 15 MG tablet, Take 1 tablet (15 mg total) by mouth 2 (two) times daily., Disp: 180 tablet, Rfl: 1   cephALEXin (KEFLEX) 500 MG capsule, Take 1 capsule (500 mg total) by mouth 3 (three) times daily for 10 days, Disp: 30 capsule, Rfl: 0   cetirizine (ZYRTEC) 10 MG tablet, Take 10 mg by mouth at bedtime. , Disp: , Rfl:    chlorpheniramine-HYDROcodone (TUSSIONEX) 10-8 MG/5ML SUER, Take 5 mLs by mouth at bedtime as needed for up to 10 days, Disp: 115 mL, Rfl: 0   ciprofloxacin-dexamethasone (CIPRODEX) OTIC suspension, Location: Right Ear. 4 drops right ear BID x 4 days, Disp: 7.5 mL, Rfl: 0   ergocalciferol (VITAMIN D2) 1.25 MG (50000 UT) capsule, Take one capsule by mouth once a week, Disp: 4 capsule, Rfl: 5   famotidine (PEPCID) 20 MG tablet, Take 1 tablet (20 mg total) by mouth nightly as needed for Heartburn, Disp: 90 tablet, Rfl: 1   fluticasone (FLONASE) 50 MCG/ACT nasal spray, PLACE 2 SPRAYS INTO BOTH  NOSTRILS ONCE DAILY, Disp: 16 g, Rfl: 11   fluticasone (FLONASE) 50 MCG/ACT nasal spray, Place 2 sprays into both nostrils once daily, Disp: 16 g, Rfl: 11   fluticasone (FLONASE) 50 MCG/ACT nasal spray, PLACE 2 SPRAYS INTO BOTH NOSTRILS ONCE DAILY, Disp: 16 g, Rfl: 11   fluticasone (FLONASE) 50 MCG/ACT nasal spray, Place 2 sprays into both nostrils once daily, Disp: 16 g, Rfl: 11   gabapentin (NEURONTIN) 300 MG capsule, Take 1 capsule (300 mg total) by mouth at bedtime., Disp: 30 capsule, Rfl: 3   ibuprofen (ADVIL) 600 MG tablet, Take 1 tablet (600 mg total) by mouth every 6 (six) hours as needed., Disp: 60 tablet, Rfl: 0   Liraglutide -Weight Management (SAXENDA) 18 MG/3ML SOPN, Inject 0.6 mg under the skin once daily for 7 days, then 1.2  mg once daily for 7 days, then 1.8 mg once daily for 7 days, then 2.4 mg once daily for 7 days., Disp: 15 mL, Rfl: 0   pantoprazole (PROTONIX) 40 MG tablet, Take 1 tablet (40 mg total) by mouth daily., Disp: 60 tablet, Rfl: 3   Phentermine-Topiramate (QSYMIA) 3.75-23 MG CP24, Take 1 capsule by mouth every morning for 30 days, Disp: 30 capsule, Rfl: 0   predniSONE (DELTASONE) 10 MG tablet, 3,3,2,2,1,1 and stop, Disp: 12 tablet, Rfl: 0   Prenatal Vit-Fe Fumarate-FA (PRENATAL MULTIVITAMIN) TABS tablet, Take 1 tablet by mouth daily at 12 noon., Disp: , Rfl:    scopolamine (TRANSDERM-SCOP) 1 MG/3DAYS, Place 1 patch (1.5 mg total) onto the skin every 3 (three) days., Disp: 4 patch, Rfl: 0   sertraline (ZOLOFT) 100 MG tablet, Take 1 tablet (100 mg total) by mouth daily., Disp: 90 tablet, Rfl: 1   sertraline (ZOLOFT) 50 MG tablet, Take 50 mg by mouth at bedtime. , Disp: , Rfl:    sertraline (ZOLOFT) 50 MG tablet, TAKE 1 TABLET (50 MG TOTAL) BY MOUTH ONCE DAILY, Disp: 90 tablet, Rfl: 1   sertraline (ZOLOFT) 50 MG tablet, TAKE 1 TABLET BY MOUTH ONCE DAILY, Disp: 90 tablet, Rfl: 1   sertraline (ZOLOFT) 50 MG tablet, TAKE 1 TABLET (50 MG TOTAL) BY MOUTH ONCE DAILY, Disp: 90  tablet, Rfl: 1   sertraline (ZOLOFT) 50 MG tablet, Take 1 tablet (50 mg total) by mouth once daily, Disp: 90 tablet, Rfl: 1   tirzepatide (MOUNJARO) 5 MG/0.5ML Pen, Inject 5 mg into the skin once a week., Disp: 2 mL, Rfl: 3   tranexamic acid (LYSTEDA) 650 MG TABS tablet, Take 2 tablets 3 times a day by oral route., Disp: 60 tablet, Rfl: 1   Vitamin D, Ergocalciferol, (DRISDOL) 1.25 MG (50000 UNIT) CAPS capsule, Take 1 capsule by mouth once weekly, Disp: 4 capsule, Rfl: 5   Vitamin D, Ergocalciferol, (DRISDOL) 1.25 MG (50000 UNIT) CAPS capsule, Take one capsule by mouth once a week, Disp: 12 capsule, Rfl: 1   Social History: Social History   Tobacco Use   Smoking status: Never   Smokeless tobacco: Never  Vaping Use   Vaping Use: Never used  Substance Use Topics   Alcohol use: Not Currently    Alcohol/week: 0.0 standard drinks of alcohol    Comment: social   Drug use: Never    Family Medical History: Family History  Problem Relation Age of Onset   Heart disease Mother        CABG   Diabetes Mother    Hypercholesterolemia Father    Hypertension Father    Heart disease Maternal Grandfather        CABG   Breast cancer Neg Hx     Physical Examination: There were no vitals filed for this visit.   General: Patient is well developed, well nourished, calm, collected, and in no apparent distress.  NEUROLOGICAL:  General: In no acute distress.   Awake, alert, oriented to person, place, and time.  Pupils equal round and reactive to light.  Facial tone is symmetric.   Tinel sign noted of the right elbow  Strength: Proximal strength is good.  Distal ulnar strength demonstrates 4 out of 5 in the intrinsic ulnar muscles.  Median muscles are 4+ to 5 out of 5.  Atrophy noted in the hypothenar muscles.  Bilateral upper and lower extremity sensation is intact to light touch with the exception of the right ulnar distribution which is  approximately 50% of that of the median.  Hoffman's  is absent.  Clonus is not present.  Toes are down-going.    Gait is normal.  No difficulty with tandem gait.    Imaging: Impression: Right ADM mild, chronic denervation changes can be seen in patient's cubital tunnel syndrome, no electrodiagnostic evidence of cervical radiculopathy. Conduction block at cubital tunnel was not observed in either ulnar nerves.  Patient's history and exam were concerning for ulnar neuropathy at cubital tunnel.  Thank you for the referral of this patient. It was our privilege to participate in care of your patient. Feel free to contact us with any further questions.  _____________________________ Cristopher Peru, M.D.  Nerve Conduction Studies Anti Sensory Summary Table   I have personally reviewed the images and agree with the above interpretation.  Labs:    Latest Ref Rng & Units 04/28/2020    3:59 AM 04/27/2020    4:02 AM 04/26/2020    7:57 AM  CBC  WBC 4.0 - 10.5 K/uL 9.7  12.6  10.5   Hemoglobin 12.0 - 15.0 g/dL 16.1  09.6  04.5   Hematocrit 36.0 - 46.0 % 30.6  32.3  37.2   Platelets 150 - 400 K/uL 293  288  383        Assessment and Plan:  Cubital tunnel syndrome on right  Donna Fowler is a pleasant 47 y.o. female with right-sided ulnar neuropathy.  She has significant ulnar weakness and numbness.  She presently 4-4 out of 5 in the ulnar intrinsic muscles.  She shows some early more ulnar clawing.  Sensory in the ulnar distribution is approximately 50% compared to the median side.  She has tried elbow bracing, elbow padding, and watchful waiting, she is now having symptoms for approximately a year and continues to worsen.  She has tried lifestyle changes.  At this point we feel that she would benefit from a ulnar nerve decompression.  Her EMG demonstrated an ulnar neuropathy at the elbow.  Will look for time for decompression.  I have discussed the condition with the patient, including showing the radiographs and discussing treatment options in layman's  terms .   Lovenia Kim, MD/MSCR Dept. of Neurosurgery

## 2023-03-25 NOTE — H&P (View-Only) (Signed)
Consulting Department:  Barbette Reichmann, MD  Primary Physician:  Barbette Reichmann, MD  Chief Complaint: Ulnar neuropathy  History of Present Illness: 03/25/2023 Donna Fowler is a 47 y.o. female who presents with the chief complaint of history of right-sided ulnar neuropathy.  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  She has been followed for ulnar neuropathy.  Has tried elbow pads and elbow braces.  She also wears a wrist brace for supposed carpal tunnel.  She gets pain shooting down the right elbow down to the medial aspect of the hand splitting the fourth digit.  She also has lost some dexterity.  Noticed some right-handed weakness.  She also has symptoms on the left but the right side is worse.  She had a previous left-sided ulnar release which gave her some immediate relief but has had some recurrence.  Conservative measures: compression sleeves, ice, elbow splints Physical therapy: has not participated Medications: tylenol, ibuprofen, gabapentin, lidocaine patches Injections: has received carpal tunnel injection in 2023 by Dr Landry Mellow - helped for about 1-2 days  The symptoms are causing a significant impact on the patient's life.   Review of Systems:  A 10 point review of systems is negative, except for the pertinent positives and negatives detailed in the HPI.  Past Medical History: Past Medical History:  Diagnosis Date   Anxiety    Arthritis    Carpal tunnel syndrome on both sides    Depression    Endocervical polyp    Gestational diabetes    Gestational hypertension    Headache    Infertility, female    Newborn product of in vitro fertilization (IVF) pregnancy     Past Surgical History: Past Surgical History:  Procedure Laterality Date   CESAREAN SECTION N/A 04/26/2020   Procedure: CESAREAN SECTION;  Surgeon: Ranae Pila, MD;  Location: [facility] LD ORS;  Service: Obstetrics;  Laterality: N/A;   COLONOSCOPY WITH ESOPHAGOGASTRODUODENOSCOPY  (EGD)  2017   COLONOSCOPY WITH PROPOFOL N/A 03/06/2022   Procedure: COLONOSCOPY WITH PROPOFOL;  Surgeon: Wyline Mood, MD;  Location: [facility] ENDOSCOPY;  Service: Gastroenterology;  Laterality: N/A;   DILATATION & CURETTAGE/HYSTEROSCOPY WITH MYOSURE N/A 06/18/2018   Procedure: DILATATION & CURETTAGE/HYSTEROSCOPY WITH MYOSURE;  Surgeon: Mitchel Honour, DO;  Location: [facility];  Service: Gynecology;  Laterality: N/A;   ELBOW ARTHROSCOPY Left 2014   ESOPHAGOGASTRODUODENOSCOPY N/A 03/06/2022   Procedure: ESOPHAGOGASTRODUODENOSCOPY (EGD);  Surgeon: Wyline Mood, MD;  Location: [facility] ENDOSCOPY;  Service: Gastroenterology;  Laterality: N/A;   excision of lymph node     axillary    LAPAROSCOPIC CHOLECYSTECTOMY  2009   PILONIDAL CYST EXCISION      Allergies: Allergies as of 03/30/2023   (No Known Allergies)    Medications:  Current Outpatient Medications:    acetaminophen (TYLENOL) 650 MG CR tablet, Take 1,300 mg by mouth every 8 (eight) hours as needed for pain. , Disp: , Rfl:    albuterol (PROVENTIL HFA;VENTOLIN HFA) 108 (90 Base) MCG/ACT inhaler, Inhale 2 puffs into the lungs every 6 (six) hours as needed for wheezing or shortness of breath. (Patient not taking: Reported on 02/13/2020), Disp: 1 Inhaler, Rfl: 0   albuterol (VENTOLIN HFA) 108 (90 Base) MCG/ACT inhaler, INHALE 2 PUFFS BY MOUTH EVERY 6 HOURS AS NEEDED FOR WHEEZING, Disp: 18 g, Rfl: 5   albuterol (VENTOLIN HFA) 108 (90 Base) MCG/ACT inhaler, INHALE 2 PUFFS BY MOUTH EVERY 6 HOURS AS NEEDED FOR WHEEZING, Disp: 18 g, Rfl: 5   albuterol (  VENTOLIN HFA) 108 (90 Base) MCG/ACT inhaler, Inhale 2 inhalations into the lungs every 6 (six) hours as needed for up to 90 days, Disp: 6.7 g, Rfl: 2   ALPRAZolam (XANAX) 0.25 MG tablet, Take 1 tablet (0.25 mg total) by mouth once daily as needed for Sleep for up to 30 days, Disp: 30 tablet, Rfl: 0   ALPRAZolam (XANAX) 0.25 MG tablet, Take 1 tablet (0.25 mg total) by mouth daily as needed for  sleep., Disp: 30 tablet, Rfl: 3   amoxicillin-clavulanate (AUGMENTIN) 875-125 MG tablet, One po BID x 10 days, Disp: 20 tablet, Rfl: 0   aspirin EC 81 MG tablet, Take 81 mg by mouth daily. Swallow whole., Disp: , Rfl:    atorvastatin (LIPITOR) 10 MG tablet, Take 1 tablet (10 mg total) by mouth once daily, Disp: 90 tablet, Rfl: 1   atorvastatin (LIPITOR) 40 MG tablet, Take 1 tablet (40 mg total) by mouth once daily, Disp: 90 tablet, Rfl: 3   atorvastatin (LIPITOR) 40 MG tablet, Take 1 tablet (40 mg total) by mouth daily., Disp: 90 tablet, Rfl: 1   busPIRone (BUSPAR) 15 MG tablet, Take 15 mg by mouth every evening. , Disp: , Rfl:    busPIRone (BUSPAR) 15 MG tablet, Take 1 tablet (15 mg total) by mouth 2 (two) times daily, Disp: 180 tablet, Rfl: 1   busPIRone (BUSPAR) 15 MG tablet, Take 1 tablet (15 mg total) by mouth 2 (two) times daily, Disp: 180 tablet, Rfl: 1   busPIRone (BUSPAR) 15 MG tablet, Take 1 tablet (15 mg total) by mouth 2 (two) times daily, Disp: 180 tablet, Rfl: 1   busPIRone (BUSPAR) 15 MG tablet, Take 1 tablet (15 mg total) by mouth 2 (two) times daily., Disp: 180 tablet, Rfl: 1   cephALEXin (KEFLEX) 500 MG capsule, Take 1 capsule (500 mg total) by mouth 3 (three) times daily for 10 days, Disp: 30 capsule, Rfl: 0   cetirizine (ZYRTEC) 10 MG tablet, Take 10 mg by mouth at bedtime. , Disp: , Rfl:    chlorpheniramine-HYDROcodone (TUSSIONEX) 10-8 MG/5ML SUER, Take 5 mLs by mouth at bedtime as needed for up to 10 days, Disp: 115 mL, Rfl: 0   ciprofloxacin-dexamethasone (CIPRODEX) OTIC suspension, Location: Right Ear. 4 drops right ear BID x 4 days, Disp: 7.5 mL, Rfl: 0   ergocalciferol (VITAMIN D2) 1.25 MG (50000 UT) capsule, Take one capsule by mouth once a week, Disp: 4 capsule, Rfl: 5   famotidine (PEPCID) 20 MG tablet, Take 1 tablet (20 mg total) by mouth nightly as needed for Heartburn, Disp: 90 tablet, Rfl: 1   fluticasone (FLONASE) 50 MCG/ACT nasal spray, PLACE 2 SPRAYS INTO BOTH  NOSTRILS ONCE DAILY, Disp: 16 g, Rfl: 11   fluticasone (FLONASE) 50 MCG/ACT nasal spray, Place 2 sprays into both nostrils once daily, Disp: 16 g, Rfl: 11   fluticasone (FLONASE) 50 MCG/ACT nasal spray, PLACE 2 SPRAYS INTO BOTH NOSTRILS ONCE DAILY, Disp: 16 g, Rfl: 11   fluticasone (FLONASE) 50 MCG/ACT nasal spray, Place 2 sprays into both nostrils once daily, Disp: 16 g, Rfl: 11   gabapentin (NEURONTIN) 300 MG capsule, Take 1 capsule (300 mg total) by mouth at bedtime., Disp: 30 capsule, Rfl: 3   ibuprofen (ADVIL) 600 MG tablet, Take 1 tablet (600 mg total) by mouth every 6 (six) hours as needed., Disp: 60 tablet, Rfl: 0   Liraglutide -Weight Management (SAXENDA) 18 MG/3ML SOPN, Inject 0.6 mg under the skin once daily for 7 days, then 1.2  mg once daily for 7 days, then 1.8 mg once daily for 7 days, then 2.4 mg once daily for 7 days., Disp: 15 mL, Rfl: 0   pantoprazole (PROTONIX) 40 MG tablet, Take 1 tablet (40 mg total) by mouth daily., Disp: 60 tablet, Rfl: 3   Phentermine-Topiramate (QSYMIA) 3.75-23 MG CP24, Take 1 capsule by mouth every morning for 30 days, Disp: 30 capsule, Rfl: 0   predniSONE (DELTASONE) 10 MG tablet, 3,3,2,2,1,1 and stop, Disp: 12 tablet, Rfl: 0   Prenatal Vit-Fe Fumarate-FA (PRENATAL MULTIVITAMIN) TABS tablet, Take 1 tablet by mouth daily at 12 noon., Disp: , Rfl:    scopolamine (TRANSDERM-SCOP) 1 MG/3DAYS, Place 1 patch (1.5 mg total) onto the skin every 3 (three) days., Disp: 4 patch, Rfl: 0   sertraline (ZOLOFT) 100 MG tablet, Take 1 tablet (100 mg total) by mouth daily., Disp: 90 tablet, Rfl: 1   sertraline (ZOLOFT) 50 MG tablet, Take 50 mg by mouth at bedtime. , Disp: , Rfl:    sertraline (ZOLOFT) 50 MG tablet, TAKE 1 TABLET (50 MG TOTAL) BY MOUTH ONCE DAILY, Disp: 90 tablet, Rfl: 1   sertraline (ZOLOFT) 50 MG tablet, TAKE 1 TABLET BY MOUTH ONCE DAILY, Disp: 90 tablet, Rfl: 1   sertraline (ZOLOFT) 50 MG tablet, TAKE 1 TABLET (50 MG TOTAL) BY MOUTH ONCE DAILY, Disp: 90  tablet, Rfl: 1   sertraline (ZOLOFT) 50 MG tablet, Take 1 tablet (50 mg total) by mouth once daily, Disp: 90 tablet, Rfl: 1   tirzepatide (MOUNJARO) 5 MG/0.5ML Pen, Inject 5 mg into the skin once a week., Disp: 2 mL, Rfl: 3   tranexamic acid (LYSTEDA) 650 MG TABS tablet, Take 2 tablets 3 times a day by oral route., Disp: 60 tablet, Rfl: 1   Vitamin D, Ergocalciferol, (DRISDOL) 1.25 MG (50000 UNIT) CAPS capsule, Take 1 capsule by mouth once weekly, Disp: 4 capsule, Rfl: 5   Vitamin D, Ergocalciferol, (DRISDOL) 1.25 MG (50000 UNIT) CAPS capsule, Take one capsule by mouth once a week, Disp: 12 capsule, Rfl: 1   Social History: Social History   Tobacco Use   Smoking status: Never   Smokeless tobacco: Never  Vaping Use   Vaping Use: Never used  Substance Use Topics   Alcohol use: Not Currently    Alcohol/week: 0.0 standard drinks of alcohol    Comment: social   Drug use: Never    Family Medical History: Family History  Problem Relation Age of Onset   Heart disease Mother        CABG   Diabetes Mother    Hypercholesterolemia Father    Hypertension Father    Heart disease Maternal Grandfather        CABG   Breast cancer Neg Hx     Physical Examination: There were no vitals filed for this visit.   General: Patient is well developed, well nourished, calm, collected, and in no apparent distress.  NEUROLOGICAL:  General: In no acute distress.   Awake, alert, oriented to person, place, and time.  Pupils equal round and reactive to light.  Facial tone is symmetric.   Tinel sign noted of the right elbow  Strength: Proximal strength is good.  Distal ulnar strength demonstrates 4 out of 5 in the intrinsic ulnar muscles.  Median muscles are 4+ to 5 out of 5.  Atrophy noted in the hypothenar muscles.  Bilateral upper and lower extremity sensation is intact to light touch with the exception of the right ulnar distribution which is  approximately 50% of that of the median.  Hoffman's  is absent.  Clonus is not present.  Toes are down-going.    Gait is normal.  No difficulty with tandem gait.    Imaging: Impression: Right ADM mild, chronic denervation changes can be seen in patient's cubital tunnel syndrome, no electrodiagnostic evidence of cervical radiculopathy. Conduction block at cubital tunnel was not observed in either ulnar nerves.  Patient's history and exam were concerning for ulnar neuropathy at cubital tunnel.  Thank you for the referral of this patient. It was our privilege to participate in care of your patient. Feel free to contact us with any further questions.  _____________________________ Cristopher Peru, M.D.  Nerve Conduction Studies Anti Sensory Summary Table   I have personally reviewed the images and agree with the above interpretation.  Labs:    Latest Ref Rng & Units 04/28/2020    3:59 AM 04/27/2020    4:02 AM 04/26/2020    7:57 AM  CBC  WBC 4.0 - 10.5 K/uL 9.7  12.6  10.5   Hemoglobin 12.0 - 15.0 g/dL 16.1  09.6  04.5   Hematocrit 36.0 - 46.0 % 30.6  32.3  37.2   Platelets 150 - 400 K/uL 293  288  383        Assessment and Plan:  Cubital tunnel syndrome on right  Ms. Blonder is a pleasant 47 y.o. female with right-sided ulnar neuropathy.  She has significant ulnar weakness and numbness.  She presently 4-4 out of 5 in the ulnar intrinsic muscles.  She shows some early more ulnar clawing.  Sensory in the ulnar distribution is approximately 50% compared to the median side.  She has tried elbow bracing, elbow padding, and watchful waiting, she is now having symptoms for approximately a year and continues to worsen.  She has tried lifestyle changes.  At this point we feel that she would benefit from a ulnar nerve decompression.  Her EMG demonstrated an ulnar neuropathy at the elbow.  Will look for time for decompression.  I have discussed the condition with the patient, including showing the radiographs and discussing treatment options in layman's  terms .   Lovenia Kim, MD/MSCR Dept. of Neurosurgery

## 2023-03-27 ENCOUNTER — Ambulatory Visit
Admission: RE | Admit: 2023-03-27 | Discharge: 2023-03-27 | Disposition: A | Discharge status: Home / Self Care | Payer: Commercial Managed Care - PPO | Source: Ambulatory Visit | Attending: Internal Medicine | Admitting: Internal Medicine

## 2023-03-27 DIAGNOSIS — Z1231 Encounter for screening mammogram for malignant neoplasm of breast: Secondary | ICD-10-CM | POA: Diagnosis not present

## 2023-03-30 ENCOUNTER — Ambulatory Visit: Payer: Commercial Managed Care - PPO | Admitting: Neurosurgery

## 2023-03-30 ENCOUNTER — Encounter: Payer: Self-pay | Admitting: Neurosurgery

## 2023-03-30 VITALS — BP 140/88 | Ht 63.0 in | Wt 190.4 lb

## 2023-03-30 DIAGNOSIS — G5621 Lesion of ulnar nerve, right upper limb: Secondary | ICD-10-CM | POA: Insufficient documentation

## 2023-04-01 ENCOUNTER — Telehealth: Payer: Self-pay

## 2023-04-01 DIAGNOSIS — G5621 Lesion of ulnar nerve, right upper limb: Secondary | ICD-10-CM

## 2023-04-01 NOTE — Telephone Encounter (Signed)
Donna Fowler contacted me requesting to schedule surgery for 04/23/23.   Planned surgery: right cubital tunnel decompression   Surgery date: 04/23/23 at [facility] - you will find out your arrival time the business day before your surgery.   Pre-op appointment at [facility] Pre-admit Testing: we will call you with a date/time for this. Pre-admit testing is located on the first floor of the [facility] building, [address] [facility] [address], [address]. Please bring all prescriptions in the original prescription bottles to your appointment, even if you have reviewed medications by phone with a pharmacy representative. During this appointment, they will advise you which medications you can take the morning of surgery, and which medications you will need to hold for surgery. Pre-op labs may be done at your pre-op appointment. You are not required to fast for these labs. Should you need to change your pre-op appointment, please call Pre-admit testing at [phone].     Aspirin: OK to stay on aspirin 81mg    Mounjaro: hold for 7 days prior to surgery    How to contact us:  If you have any questions/concerns before or after surgery, you can reach [place] at [phone], or you can send a mychart message. We can be reached by phone or mychart 8am-4pm, Monday-Friday.  *Please note: Calls after 4pm are forwarded to a third party answering service. Mychart messages are not routinely monitored during evenings, weekends, and holidays. Please call our office to contact the answering service for urgent concerns during non-business hours.   If you have FMLA/disability paperwork, please drop it off or fax it to [phone], attention Patty.   Appointments/FMLA & disability paperwork: Patty & Cristin  Nurse: Royston Cowper  Medical assistants: Laurann Montana Physician Assistant's: Manning Charity & Drake Leach Surgeons: Venetia Night, MD & Ernestine Mcmurray, MD

## 2023-04-02 ENCOUNTER — Other Ambulatory Visit: Payer: Self-pay

## 2023-04-02 DIAGNOSIS — Z01818 Encounter for other preprocedural examination: Secondary | ICD-10-CM

## 2023-04-15 ENCOUNTER — Encounter
Admission: RE | Admit: 2023-04-15 | Discharge: 2023-04-15 | Disposition: A | Discharge status: Home / Self Care | Payer: Commercial Managed Care - PPO | Source: Ambulatory Visit | Attending: Neurosurgery | Admitting: Neurosurgery

## 2023-04-15 VITALS — Ht 63.0 in | Wt 190.5 lb

## 2023-04-15 DIAGNOSIS — Z01818 Encounter for other preprocedural examination: Secondary | ICD-10-CM

## 2023-04-15 HISTORY — DX: Lesion of ulnar nerve, right upper limb: G56.21

## 2023-04-15 HISTORY — DX: Other complications of anesthesia, initial encounter: T88.59XA

## 2023-04-15 HISTORY — DX: Anemia, unspecified: D64.9

## 2023-04-15 NOTE — Patient Instructions (Addendum)
Your procedure is scheduled on:04-23-23 Thursday Report to the Registration Desk on the 1st floor of the [facility].Then proceed to the 2nd floor Surgery Desk To find out your arrival time, please call [phone] between 1PM - 3PM on:04-22-23 Wednesday If your arrival time is 6:00 am, do not arrive before that time as the [facility] entrance doors do not open until 6:00 am.  REMEMBER: Instructions that are not followed completely may result in serious medical risk, up to and including death; or upon the discretion of your surgeon and anesthesiologist your surgery may need to be rescheduled.  Do not eat food after midnight the night before surgery.  No gum chewing or hard candies.  You may however, drink CLEAR liquids up to 2 hours before you are scheduled to arrive for your surgery. Do not drink anything within 2 hours of your scheduled arrival time.  Clear liquids include: - water  - apple juice without pulp - gatorade (not RED colors) - black coffee or tea (Do NOT add milk or creamers to the coffee or tea) Do NOT drink anything that is not on this list.  One week prior to surgery: Stop ANY OVER THE COUNTER supplements/vitamins NOW (04-15-23) until after surgery  Continue taking all prescribed medications with the exception of the following: -tirzepatide (MOUNJARO)-Stop 7 days prior to surgery-Last dose was on 04-09-23 -Do NOT take again until AFTER surgery  TAKE ONLY THESE MEDICATIONS THE MORNING OF SURGERY WITH A SIP OF WATER: -pantoprazole (PROTONIX)-take one the night before and one on the morning of surgery - helps to prevent nausea after surgery.)  Continue your 81 mg Aspirin up until the day prior to surgery-Do NOT take the morning of surgery  Bring your Albuterol Inhaler to the hospital  No Alcohol for 24 hours before or after surgery.  No Smoking including e-cigarettes for 24 hours before surgery.  No chewable tobacco products for at least 6 hours before surgery.  No  nicotine patches on the day of surgery.  Do not use any "recreational" drugs for at least a week (preferably 2 weeks) before your surgery.  Please be advised that the combination of cocaine and anesthesia may have negative outcomes, up to and including death. If you test positive for cocaine, your surgery will be cancelled.  On the morning of surgery brush your teeth with toothpaste and water, you may rinse your mouth with mouthwash if you wish. Do not swallow any toothpaste or mouthwash.  Use CHG Soap as directed on instruction sheet.  Do not wear jewelry, make-up, hairpins, clips or nail polish.  Do not wear lotions, powders, or perfumes.   Do not shave body hair from the neck down 48 hours before surgery.  Contact lenses, hearing aids and dentures may not be worn into surgery.  Do not bring valuables to the hospital. [facility] is not responsible for any missing/lost belongings or valuables.    Notify your doctor if there is any change in your medical condition (cold, fever, infection).  Wear comfortable clothing (specific to your surgery type) to the hospital.  After surgery, you can help prevent lung complications by doing breathing exercises.  Take deep breaths and cough every 1-2 hours. Your doctor may order a device called an Incentive Spirometer to help you take deep breaths. When coughing or sneezing, hold a pillow firmly against your incision with both hands. This is called "splinting." Doing this helps protect your incision. It also decreases belly discomfort.  If you are  being admitted to the hospital overnight, leave your suitcase in the car. After surgery it may be brought to your room.  In case of increased patient census, it may be necessary for you, the patient, to continue your postoperative care in the Same Day Surgery department.  If you are being discharged the day of surgery, you will not be allowed to drive home. You will need a responsible individual to  drive you home and stay with you for 24 hours after surgery.   If you are taking public transportation, you will need to have a responsible individual with you.  Please call the Pre-admissions Testing Dept. at [phone] if you have any questions about these instructions.  Surgery Visitation Policy:  Patients having surgery or a procedure may have two visitors.  Children under the age of 37 must have an adult with them who is not the patient.     Preparing for Surgery with CHLORHEXIDINE GLUCONATE (CHG) Soap  Chlorhexidine Gluconate (CHG) Soap  o An antiseptic cleaner that kills germs and bonds with the skin to continue killing germs even after washing  o Used for showering the night before surgery and morning of surgery  Before surgery, you can play an important role by reducing the number of germs on your skin.  CHG (Chlorhexidine gluconate) soap is an antiseptic cleanser which kills germs and bonds with the skin to continue killing germs even after washing.  Please do not use if you have an allergy to CHG or antibacterial soaps. If your skin becomes reddened/irritated stop using the CHG.  1. Shower the NIGHT BEFORE SURGERY and the MORNING OF SURGERY with CHG soap.  2. If you choose to wash your hair, wash your hair first as usual with your normal shampoo.  3. After shampooing, rinse your hair and body thoroughly to remove the shampoo.  4. Use CHG as you would any other liquid soap. You can apply CHG directly to the skin and wash gently with a scrungie or a clean washcloth.  5. Apply the CHG soap to your body only from the neck down. Do not use on open wounds or open sores. Avoid contact with your eyes, ears, mouth, and genitals (private parts). Wash face and genitals (private parts) with your normal soap.  6. Wash thoroughly, paying special attention to the area where your surgery will be performed.  7. Thoroughly rinse your body with warm water.  8. Do not shower/wash  with your normal soap after using and rinsing off the CHG soap.  9. Pat yourself dry with a clean towel.  10. Wear clean pajamas to bed the night before surgery.  12. Place clean sheets on your bed the night of your first shower and do not sleep with pets.  13. Shower again with the CHG soap on the day of surgery prior to arriving at the hospital.  14. Do not apply any deodorants/lotions/powders.  15. Please wear clean clothes to the hospital.

## 2023-04-22 MED ORDER — CEFAZOLIN IN SODIUM CHLORIDE 2-0.9 GM/100ML-% IV SOLN
2.0000 g | Freq: Once | INTRAVENOUS | Status: AC
  Administered 2023-04-23: 2 g via INTRAVENOUS
  Filled 2023-04-22: qty 100

## 2023-04-22 MED ORDER — LACTATED RINGERS IV SOLN: INTRAVENOUS | Status: DC

## 2023-04-22 MED ORDER — CHLORHEXIDINE GLUCONATE 0.12 % MT SOLN
15.0000 mL | Freq: Once | OROMUCOSAL | Status: AC
  Administered 2023-04-23: 15 mL via OROMUCOSAL

## 2023-04-22 MED ORDER — ORAL CARE MOUTH RINSE: 15.0000 mL | Freq: Once | OROMUCOSAL | Status: AC

## 2023-04-23 ENCOUNTER — Encounter
Admission: RE | Disposition: A | Discharge status: Home / Self Care | Payer: Self-pay | Source: Home / Self Care | Attending: Neurosurgery

## 2023-04-23 ENCOUNTER — Other Ambulatory Visit: Payer: Self-pay

## 2023-04-23 ENCOUNTER — Encounter: Payer: Self-pay | Admitting: Neurosurgery

## 2023-04-23 ENCOUNTER — Ambulatory Visit: Payer: Self-pay | Admitting: Certified Registered"

## 2023-04-23 ENCOUNTER — Ambulatory Visit
Admission: RE | Admit: 2023-04-23 | Discharge: 2023-04-23 | Disposition: A | Discharge status: Home / Self Care | Payer: Commercial Managed Care - PPO | Attending: Neurosurgery | Admitting: Neurosurgery

## 2023-04-23 ENCOUNTER — Ambulatory Visit: Payer: Commercial Managed Care - PPO | Admitting: Certified Registered"

## 2023-04-23 ENCOUNTER — Other Ambulatory Visit: Payer: Self-pay | Admitting: Neurosurgery

## 2023-04-23 DIAGNOSIS — J45909 Unspecified asthma, uncomplicated: Secondary | ICD-10-CM | POA: Diagnosis not present

## 2023-04-23 DIAGNOSIS — I10 Essential (primary) hypertension: Secondary | ICD-10-CM | POA: Diagnosis not present

## 2023-04-23 DIAGNOSIS — Z6833 Body mass index (BMI) 33.0-33.9, adult: Secondary | ICD-10-CM | POA: Insufficient documentation

## 2023-04-23 DIAGNOSIS — F419 Anxiety disorder, unspecified: Secondary | ICD-10-CM | POA: Insufficient documentation

## 2023-04-23 DIAGNOSIS — E669 Obesity, unspecified: Secondary | ICD-10-CM | POA: Insufficient documentation

## 2023-04-23 DIAGNOSIS — K219 Gastro-esophageal reflux disease without esophagitis: Secondary | ICD-10-CM | POA: Insufficient documentation

## 2023-04-23 DIAGNOSIS — F32A Depression, unspecified: Secondary | ICD-10-CM | POA: Diagnosis not present

## 2023-04-23 DIAGNOSIS — G5621 Lesion of ulnar nerve, right upper limb: Secondary | ICD-10-CM | POA: Insufficient documentation

## 2023-04-23 DIAGNOSIS — M1990 Unspecified osteoarthritis, unspecified site: Secondary | ICD-10-CM | POA: Insufficient documentation

## 2023-04-23 DIAGNOSIS — Z8249 Family history of ischemic heart disease and other diseases of the circulatory system: Secondary | ICD-10-CM | POA: Insufficient documentation

## 2023-04-23 DIAGNOSIS — Z01818 Encounter for other preprocedural examination: Secondary | ICD-10-CM

## 2023-04-23 HISTORY — PX: ANTERIOR INTEROSSEOUS NERVE DECOMPRESSION: SHX5735

## 2023-04-23 LAB — POCT PREGNANCY, URINE: Preg Test, Ur: NEGATIVE

## 2023-04-23 SURGERY — ANTERIOR INTEROSSEOUS NERVE DECOMPRESSION
Anesthesia: General | Laterality: Right

## 2023-04-23 MED ORDER — MIDAZOLAM HCL 2 MG/2ML IJ SOLN
INTRAMUSCULAR | Status: AC
  Filled 2023-04-23: qty 2

## 2023-04-23 MED ORDER — SEVOFLURANE IN SOLN
RESPIRATORY_TRACT | Status: AC
  Filled 2023-04-23: qty 250

## 2023-04-23 MED ORDER — DEXMEDETOMIDINE HCL IN NACL 80 MCG/20ML IV SOLN
INTRAVENOUS | Status: DC | PRN
  Administered 2023-04-23 (×3): 4 ug via INTRAVENOUS

## 2023-04-23 MED ORDER — PROPOFOL 10 MG/ML IV BOLUS
INTRAVENOUS | Status: AC
  Filled 2023-04-23: qty 20

## 2023-04-23 MED ORDER — OXYCODONE HCL 5 MG PO TABS: 5.0000 mg | ORAL_TABLET | Freq: Once | ORAL | Status: DC | PRN

## 2023-04-23 MED ORDER — DEXAMETHASONE SODIUM PHOSPHATE 10 MG/ML IJ SOLN
INTRAMUSCULAR | Status: DC | PRN
  Administered 2023-04-23: 10 mg via INTRAVENOUS

## 2023-04-23 MED ORDER — MIDAZOLAM HCL 2 MG/2ML IJ SOLN
INTRAMUSCULAR | Status: DC | PRN
  Administered 2023-04-23 (×2): 1 mg via INTRAVENOUS

## 2023-04-23 MED ORDER — PROPOFOL 10 MG/ML IV BOLUS
INTRAVENOUS | Status: AC
  Filled 2023-04-23: qty 40

## 2023-04-23 MED ORDER — CEFAZOLIN SODIUM-DEXTROSE 2-4 GM/100ML-% IV SOLN
INTRAVENOUS | Status: AC
  Filled 2023-04-23: qty 100

## 2023-04-23 MED ORDER — CHLORHEXIDINE GLUCONATE 0.12 % MT SOLN
OROMUCOSAL | Status: AC
  Filled 2023-04-23: qty 15

## 2023-04-23 MED ORDER — ONDANSETRON HCL 4 MG/2ML IJ SOLN
INTRAMUSCULAR | Status: DC | PRN
  Administered 2023-04-23: 4 mg via INTRAVENOUS

## 2023-04-23 MED ORDER — FENTANYL CITRATE (PF) 100 MCG/2ML IJ SOLN
INTRAMUSCULAR | Status: DC | PRN
  Administered 2023-04-23 (×2): 25 ug via INTRAVENOUS
  Administered 2023-04-23: 50 ug via INTRAVENOUS

## 2023-04-23 MED ORDER — HYDROCODONE-ACETAMINOPHEN 5-325 MG PO TABS
1.0000 | ORAL_TABLET | ORAL | 0 refills | Status: AC | PRN
  Filled 2023-04-23: qty 30, 5d supply, fill #0

## 2023-04-23 MED ORDER — OXYCODONE HCL 5 MG PO TABS
5.0000 mg | ORAL_TABLET | ORAL | 0 refills | Status: DC | PRN
  Filled 2023-04-23: qty 30, 5d supply, fill #0

## 2023-04-23 MED ORDER — BUPIVACAINE HCL (PF) 0.5 % IJ SOLN
INTRAMUSCULAR | Status: DC | PRN
  Administered 2023-04-23: 4 mL

## 2023-04-23 MED ORDER — FENTANYL CITRATE (PF) 100 MCG/2ML IJ SOLN
INTRAMUSCULAR | Status: AC
  Filled 2023-04-23: qty 2

## 2023-04-23 MED ORDER — OXYCODONE HCL 5 MG/5ML PO SOLN: 5.0000 mg | Freq: Once | ORAL | Status: DC | PRN

## 2023-04-23 MED ORDER — BUPIVACAINE HCL (PF) 0.5 % IJ SOLN
INTRAMUSCULAR | Status: AC
  Filled 2023-04-23: qty 30

## 2023-04-23 MED ORDER — LIDOCAINE HCL (PF) 2 % IJ SOLN
INTRAMUSCULAR | Status: DC | PRN
Start: 2023-04-23 — End: 2023-04-23
  Administered 2023-04-23: 50 mg

## 2023-04-23 MED ORDER — ONDANSETRON HCL 4 MG PO TABS
4.0000 mg | ORAL_TABLET | Freq: Three times a day (TID) | ORAL | 0 refills | Status: DC | PRN
  Filled 2023-04-23: qty 15, 5d supply, fill #0

## 2023-04-23 MED ORDER — PROPOFOL 500 MG/50ML IV EMUL
INTRAVENOUS | Status: DC | PRN
  Administered 2023-04-23: 100 ug/kg/min via INTRAVENOUS

## 2023-04-23 MED ORDER — LIDOCAINE HCL (PF) 2 % IJ SOLN
INTRAMUSCULAR | Status: AC
  Filled 2023-04-23: qty 5

## 2023-04-23 MED ORDER — ACETAMINOPHEN 10 MG/ML IV SOLN
INTRAVENOUS | Status: DC | PRN
  Administered 2023-04-23: 1000 mg via INTRAVENOUS

## 2023-04-23 MED ORDER — FENTANYL CITRATE (PF) 100 MCG/2ML IJ SOLN: 25.0000 ug | INTRAMUSCULAR | Status: DC | PRN

## 2023-04-23 MED ORDER — ONDANSETRON HCL 4 MG/2ML IJ SOLN: 4.0000 mg | Freq: Once | INTRAMUSCULAR | Status: DC | PRN

## 2023-04-23 MED ORDER — MIDAZOLAM HCL 2 MG/2ML IJ SOLN
1.0000 mg | Freq: Once | INTRAMUSCULAR | Status: AC
  Administered 2023-04-23: 2 mg via INTRAVENOUS

## 2023-04-23 MED ORDER — ACETAMINOPHEN 10 MG/ML IV SOLN: 1000.0000 mg | Freq: Once | INTRAVENOUS | Status: DC | PRN

## 2023-04-23 MED ORDER — LACTATED RINGERS IV SOLN: INTRAVENOUS | Status: DC

## 2023-04-23 MED ORDER — 0.9 % SODIUM CHLORIDE (POUR BTL) OPTIME
TOPICAL | Status: DC | PRN
  Administered 2023-04-23: 300 mL

## 2023-04-23 SURGICAL SUPPLY — 32 items
APL PRP STRL LF DISP 70% ISPRP (MISCELLANEOUS) ×1
BNDG CMPR STD VLCR NS LF 5.8X2 (GAUZE/BANDAGES/DRESSINGS)
BNDG ELASTIC 2X5.8 VLCR NS LF (GAUZE/BANDAGES/DRESSINGS) ×1 IMPLANT
BNDG ESMARCH 4 X 12 STRL LF (GAUZE/BANDAGES/DRESSINGS)
BNDG ESMARCH 4X12 STRL LF (GAUZE/BANDAGES/DRESSINGS) ×1 IMPLANT
CHLORAPREP W/TINT 26 (MISCELLANEOUS) ×1 IMPLANT
CORD BIP STRL DISP 12FT (MISCELLANEOUS) ×1 IMPLANT
DRAPE SURG 17X11 SM STRL (DRAPES) ×2 IMPLANT
FORCEPS JEWEL BIP 4-3/4 STR (INSTRUMENTS) ×1 IMPLANT
GAUZE SPONGE 4X4 12PLY STRL (GAUZE/BANDAGES/DRESSINGS) ×1 IMPLANT
GAUZE XEROFORM 1X8 LF (GAUZE/BANDAGES/DRESSINGS) ×1 IMPLANT
GLOVE BIOGEL PI IND STRL 8 (GLOVE) ×1 IMPLANT
GLOVE SURG SYN 7.5 E (GLOVE) ×1 IMPLANT
GLOVE SURG SYN 7.5 PF PI (GLOVE) ×1 IMPLANT
GOWN STRL REUS W/ TWL LRG LVL3 (GOWN DISPOSABLE) ×1 IMPLANT
GOWN STRL REUS W/ TWL XL LVL3 (GOWN DISPOSABLE) ×1 IMPLANT
GOWN STRL REUS W/TWL LRG LVL3 (GOWN DISPOSABLE) ×1
GOWN STRL REUS W/TWL XL LVL3 (GOWN DISPOSABLE) ×1
KIT TURNOVER KIT A (KITS) ×1 IMPLANT
MANIFOLD NEPTUNE II (INSTRUMENTS) ×1 IMPLANT
NDL HYPO 25X1 1.5 SAFETY (NEEDLE) ×1 IMPLANT
NEEDLE HYPO 25X1 1.5 SAFETY (NEEDLE) ×1 IMPLANT
NS IRRIG 500ML POUR BTL (IV SOLUTION) ×1 IMPLANT
PACK EXTREMITY ARMC (MISCELLANEOUS) ×1 IMPLANT
STOCKINETTE IMPERVIOUS 9X36 MD (GAUZE/BANDAGES/DRESSINGS) ×1 IMPLANT
SUT DVC VLOC 3-0 CL 6 P-12 (SUTURE) IMPLANT
SUT VIC AB 2-0 SH 27 (SUTURE) ×1
SUT VIC AB 2-0 SH 27XBRD (SUTURE) IMPLANT
SUT VIC AB 3-0 SH 27 (SUTURE) ×1
SUT VIC AB 3-0 SH 27X BRD (SUTURE) IMPLANT
TRAP FLUID SMOKE EVACUATOR (MISCELLANEOUS) ×1 IMPLANT
WATER STERILE IRR 500ML POUR (IV SOLUTION) ×1 IMPLANT

## 2023-04-23 NOTE — Anesthesia Postprocedure Evaluation (Signed)
Anesthesia Post Note  Patient: Donna Fowler  Procedure(s) Performed: RIGHT CUBITAL TUNNEL DECOMPRESSION (Right)  Patient location during evaluation: PACU Anesthesia Type: General Level of consciousness: awake and alert, oriented and patient cooperative Pain management: pain level controlled Vital Signs Assessment: post-procedure vital signs reviewed and stable Respiratory status: spontaneous breathing, nonlabored ventilation and respiratory function stable Cardiovascular status: blood pressure returned to baseline and stable Postop Assessment: adequate PO intake Anesthetic complications: no   No notable events documented.   Last Vitals:  Vitals:   04/23/23 1004 04/23/23 1034  BP: 139/89 128/85  Pulse: 66 65  Resp: 18 16  Temp: (!) 36.1 C   SpO2: 100% 99%    Last Pain:  Vitals:   04/23/23 1004  TempSrc: Temporal  PainSc: 4                  Reed Breech

## 2023-04-23 NOTE — Interval H&P Note (Signed)
History and Physical Interval Note:  04/23/2023 7:04 AM  Donna Fowler  has presented today for surgery, with the diagnosis of Cubital tunnel syndrome on right  - G56.21.  The various methods of treatment have been discussed with the patient and family. After consideration of risks, benefits and other options for treatment, the patient has consented to  Procedure(s) with comments: RIGHT CUBITAL TUNNEL DECOMPRESSION (Right) - LOCAL W/ MAC as a surgical intervention.  The patient's history has been reviewed, patient examined, no change in status, stable for surgery.  I have reviewed the patient's chart and labs.  Questions were answered to the patient's satisfaction.     Loreen Freud

## 2023-04-23 NOTE — Anesthesia Preprocedure Evaluation (Signed)
Anesthesia Evaluation  Patient identified by MRN, date of birth, ID band Patient awake    Reviewed: Allergy & Precautions, NPO status , Patient's Chart, lab work & pertinent test results  History of Anesthesia Complications Negative for: history of anesthetic complications  Airway Mallampati: I   Neck ROM: Full    Dental no notable dental hx.    Pulmonary asthma    Pulmonary exam normal breath sounds clear to auscultation       Cardiovascular hypertension, Normal cardiovascular exam Rhythm:Regular Rate:Normal     Neuro/Psych  PSYCHIATRIC DISORDERS Anxiety Depression    negative neurological ROS     GI/Hepatic ,GERD  ,,  Endo/Other  diabetes, Type 2  Obesity   Renal/GU negative Renal ROS     Musculoskeletal  (+) Arthritis ,    Abdominal   Peds  Hematology negative hematology ROS (+)   Anesthesia Other Findings   Reproductive/Obstetrics                             Anesthesia Physical Anesthesia Plan  ASA: 2  Anesthesia Plan: General   Post-op Pain Management:    Induction: Intravenous  PONV Risk Score and Plan: 3 and Propofol infusion, TIVA and Treatment may vary due to age or medical condition  Airway Management Planned: Natural Airway  Additional Equipment:   Intra-op Plan:   Post-operative Plan:   Informed Consent: I have reviewed the patients History and Physical, chart, labs and discussed the procedure including the risks, benefits and alternatives for the proposed anesthesia with the patient or authorized representative who has indicated his/her understanding and acceptance.       Plan Discussed with: CRNA  Anesthesia Plan Comments: (LMA/GETA backup discussed.  Patient consented for risks of anesthesia including but not limited to:  - adverse reactions to medications - damage to eyes, teeth, lips or other oral mucosa - nerve damage due to positioning  - sore  throat or hoarseness - damage to heart, brain, nerves, lungs, other parts of body or loss of life  Informed patient about role of CRNA in peri- and intra-operative care.  Patient voiced understanding.)        Anesthesia Quick Evaluation

## 2023-04-23 NOTE — Discharge Summary (Signed)
Physician Discharge Summary  Patient ID: Donna Fowler [birth year] MRN [id number]  Admit date: 04/20/2023 Discharge date: 04/23/2023  Admission Diagnoses: Right sided ulnar neuropathy with hand weakness   Discharged Condition: good  Hospital Course:  Donna Fowler is a pleasant 47 y.o presenting with right sided ulnar neuropathy s/p decompression. Intraoperative course was uncomplicated. She was discharged home after meeting all post-op goals. She was given prescriptions for Norco and Zofran.  Consults: None  Significant Diagnostic Studies: none  Treatments: surgery: as above. Please see separately dictated operative report for further details  Discharge Exam: Today's Vitals   04/23/23 0930 04/23/23 0945 04/23/23 1004 04/23/23 1034  BP: 110/78 121/85 139/89 128/85  Pulse: 65 69 66 65  Resp: 15 18 18 16   Temp:  97.8 F (36.6 C) (!) 97 F (36.1 C)   TempSrc:   Temporal   SpO2: 98% 99% 100% 99%  Weight:      Height:      PainSc: Asleep 3  4     Body mass index is 33.74 kg/m.  CN II-XII grossly intact 5/5 throughout BUE Clean post-op dressing in place  Disposition: home   Signed: Susanne Borders 04/23/2023, 11:14 AM

## 2023-04-23 NOTE — Discharge Instructions (Addendum)
NEUROSURGERY DISCHARGE INSTRUCTIONS  Admission diagnosis: Cubital tunnel syndrome on right  - G56.21  Operative procedure: Right cubital tunnel decompression  What to do after you leave the hospital:  Recommended diet: regular diet. Increase protein intake to promote wound healing.  Recommended activity:  No lifting great than 10 lbs .You should walk multiple times per day  Special Instructions  No straining, no heavy lifting > 10lbs x 4 weeks.  Keep incision area clean and dry. May shower in 2 days. No baths or pools for 6 weeks.  Please remove dressing pn 8/4 , no need to apply a bandage afterwards  You have no sutures to remove, the skin is closed with adhesive  Please take pain medications as directed. Take a stool softener if on pain medications   Please Report any of the following: Nausea or Vomiting, Temperature is greater than 101.3F (38.1C) degrees, Dizziness, Abdominal Pain, Difficulty Breathing or Shortness of Breath, Inability to Eat, drink Fluids, or Take medications, Bleeding, swelling, or drainage from surgical incision sites, New numbness or weakness, and Bowel or bladder dysfunction to the neurosurgeon on call. How to contact us:  If you have any questions/concerns before or after surgery, you can reach [place] at [phone], or you can send a mychart message. We can be reached by phone or mychart 8am-4pm, Monday-Friday.  *Please note: Calls after 4pm are forwarded to a third party answering service. Mychart messages are not routinely monitored during evenings, weekends, and holidays. Please call our office to contact the answering service for urgent concerns during non-business hours.   Additional Follow up appointments Please follow up with Drake Leach PA-C as scheduled in 2-3 weeks   Please see below for scheduled appointments:  Future Appointments  Date Time Provider Department Center  05/07/2023 10:30 AM Drake Leach, PA-C [facility] None  06/03/2023  2:45 PM Loreen Freud, MD [facility] None   AMBULATORY SURGERY  DISCHARGE INSTRUCTIONS   The drugs that you were given will stay in your system until tomorrow so for the next 24 hours you should not:  Drive an automobile Make any legal decisions Drink any alcoholic beverage   You may resume regular meals tomorrow.  Today it is better to start with liquids and gradually work up to solid foods.  You may eat anything you prefer, but it is better to start with liquids, then soup and crackers, and gradually work up to solid foods.   Please notify your doctor immediately if you have any unusual bleeding, trouble breathing, redness and pain at the surgery site, drainage, fever, or pain not relieved by medication.    Additional Instructions:    Please contact your physician with any problems or Same Day Surgery at [phone], Monday through Friday 6 am to 4 pm, or [place] at [facility] number at [phone].

## 2023-04-23 NOTE — Op Note (Signed)
Indications: 47 year old woman with history of progressive right-sided ulnar neuropathy and hand weakness refractory to conservative management.  Findings:   Preoperative Diagnosis:  Patient Active Problem List   Diagnosis Date Noted   Cubital tunnel syndrome on right 03/30/2023   Postoperative Diagnosis: same   EBL: 20 cc IVF: See anesthesia report Drains: none Disposition:Stable to PACU Complications: none  No foley catheter was placed.   Preoperative Note: 47 year old woman with a history of progressive right-sided ulnar neuropathy and hand weakness refractory to conservative management.  She had tried rest, padding, and watchful waiting but had continued progressive symptoms.  Given the progression of her ulnar neuropathy plan was made for ulnar nerve decompression without transposition.  Risk of surgery is discussed and include: Infection, bleeding, wound healing issues, nerve injury, pain, failure to relieve the symptoms, need for further surgery.  Procedure:  1) right-sided ulnar nerve decompression at the elbow   Procedure: After obtaining informed consent, the patient taken to the operating room, placed in supine position, monitored anesthesia care was induced.  She was given preoperative antibiotics.  Prepped and draped in the usual fashion.  Comprehensive timeout was performed verifying the patient's name, MRN, planned procedure.  The humerus was padded, elbow was externally rotated, a curvilinear incision over the medial aspect of the elbow was planned.  This went proximal to the medial epicondyle as well as distal to cover the space from the cubital tunnel to the intermuscular septum/arcade of Struthers.  Local anesthetic was injected, skin was opened sharply, the skin was dissected down to the subcutaneous fascia.  Care was taken not to sacrifice any cutaneous nerves while doing this dissection.  We are able to feel and palpate the intermuscular septum.  This was where  we are able to identify the nerve initially.  The nerve was underneath the septum but not being compressed by it, it was however being compressed by a arcade of Struthers.  This was divided sharply.  We then continued to follow the nerve distally decompressing on the way.  We found it in the retrocondylar groove, we removed all the soft tissue overlying.  There was a small a conus at Prolaris causing severe compression of the nerve we divided the muscle and its fascia.  We then continue to follow it distally.  We are able to identify the fascia overlying the heads of the FCU.  This was divided sharply.  We are then able to split the heads of the FCU and identify Osborne's band.  This was severely compressive.  We then divided this.  At this point the nerve was well decompressed from proximal to the arcade of Struthers to distal to Osborne's band.  There were no areas of ongoing compression.  The nerve did not dislocate.  When the elbow was bent there were no evidence of any ongoing compression.  The wound was copiously irrigated.  The wound was then closed in multiple layers.  Sterile dressing was applied.  No immediate complications.  Sponge and pattie counts were correct at the end of the procedure.   Manning Charity PA assisted in the procedure. An assistant was required for this procedure due to the complexity.  The assistant provided assistance in tissue manipulation and suction, and was required for the successful and safe performance of the procedure. I performed the critical portions of the procedure.  Lovenia Kim, MD/MSCR

## 2023-04-23 NOTE — Transfer of Care (Signed)
Immediate Anesthesia Transfer of Care Note  Patient: Donna Fowler  Procedure(s) Performed: RIGHT CUBITAL TUNNEL DECOMPRESSION (Right)  Patient Location: PACU  Anesthesia Type:General  Level of Consciousness: drowsy and patient cooperative  Airway & Oxygen Therapy: Patient Spontanous Breathing  Post-op Assessment: Report given to RN and Post -op Vital signs reviewed and stable  Post vital signs: Reviewed and stable  Last Vitals:  Vitals Value Taken Time  BP 109/71 04/23/23 0852  Temp 36.3 C 04/23/23 0852  Pulse 71 04/23/23 0900  Resp 20 04/23/23 0900  SpO2 98 % 04/23/23 0900  Vitals shown include unfiled device data.  Last Pain:  Vitals:   04/23/23 0852  TempSrc:   PainSc: Asleep      Patients Stated Pain Goal: 2 (04/23/23 4132)  Complications: No notable events documented.

## 2023-04-23 NOTE — Progress Notes (Signed)
Pt requested changing from Oxycodone due to history of nausea. Norco and Zofran sent in

## 2023-04-24 ENCOUNTER — Encounter: Payer: Self-pay | Admitting: Neurosurgery

## 2023-05-06 NOTE — Progress Notes (Signed)
   REFERRING PHYSICIAN:  Barbette Reichmann, Md [address] [place],  [place] [address]  DOS: 04/23/23  right-sided ulnar nerve decompression at the elbow   HISTORY OF PRESENT ILLNESS: Donna Fowler is 2 weeks status post  right-sided ulnar nerve decompression at the elbow . Given norco and zanaflex on discharge from the hospital.   She has noted improvement in elbow pain and swelling. She still notes some stiffness in the elbow. She is taking tylenol as needed for pain.   She has occasional numbness/tingling in right arm into small/ring finger that is similar to preop, but it goes away. No weakness noted.    PHYSICAL EXAMINATION:  NEUROLOGICAL:  General: In no acute distress.   Awake, alert, oriented to person, place, and time.  Pupils equal round and reactive to light.  Facial tone is symmetric.    Strength: Side Biceps Triceps Deltoid Interossei Grip Wrist Ext. Wrist Flex.  R 5 5 5 5 5 5 5   L 5 5 5 5 5 5 5    Incision c/d/I, minimal swelling   Imaging:  Nothing new to review.   Assessment / Plan: Donna Fowler is doing well s/p above surgery. Treatment options reviewed with patient and following plan made:   - She can slowly return to activity as tolerated.  - Okay to return back to work. Will do 15 pound lifting restriction.  - Reviewed wound care.  - Continue current medications including prn OTC tylenol.  - Follow up as scheduled in 4 weeks with Dr. Katrinka Blazing. Can likely return to full duty at that point.   Advised to contact the office if any questions or concerns arise.   Drake Leach PA-C Dept of Neurosurgery

## 2023-05-07 ENCOUNTER — Ambulatory Visit (INDEPENDENT_AMBULATORY_CARE_PROVIDER_SITE_OTHER): Payer: Commercial Managed Care - PPO | Admitting: Orthopedic Surgery

## 2023-05-07 ENCOUNTER — Encounter: Payer: Self-pay | Admitting: Orthopedic Surgery

## 2023-05-07 VITALS — BP 136/84 | Ht 63.0 in | Wt 192.0 lb

## 2023-05-07 DIAGNOSIS — G5621 Lesion of ulnar nerve, right upper limb: Secondary | ICD-10-CM

## 2023-05-07 DIAGNOSIS — Z9889 Other specified postprocedural states: Secondary | ICD-10-CM

## 2023-05-07 DIAGNOSIS — Z09 Encounter for follow-up examination after completed treatment for conditions other than malignant neoplasm: Secondary | ICD-10-CM

## 2023-05-25 ENCOUNTER — Other Ambulatory Visit: Payer: Self-pay

## 2023-05-26 ENCOUNTER — Other Ambulatory Visit: Payer: Self-pay

## 2023-05-26 MED ORDER — VITAMIN D (ERGOCALCIFEROL) 1.25 MG (50000 UNIT) PO CAPS
50000.0000 [IU] | ORAL_CAPSULE | ORAL | 5 refills | Status: AC
  Filled 2023-05-26: qty 4, 28d supply, fill #0
  Filled 2023-07-04: qty 4, 28d supply, fill #1
  Filled 2023-08-23: qty 4, 28d supply, fill #2
  Filled 2023-11-08: qty 4, 28d supply, fill #3
  Filled 2023-12-15: qty 4, 28d supply, fill #4
  Filled 2024-01-12: qty 4, 28d supply, fill #5

## 2023-06-03 ENCOUNTER — Encounter: Payer: Self-pay | Admitting: Neurosurgery

## 2023-06-03 ENCOUNTER — Other Ambulatory Visit: Payer: Self-pay

## 2023-06-03 ENCOUNTER — Ambulatory Visit (INDEPENDENT_AMBULATORY_CARE_PROVIDER_SITE_OTHER): Payer: Commercial Managed Care - PPO | Admitting: Neurosurgery

## 2023-06-03 VITALS — BP 136/86 | Temp 99.2°F | Ht 63.0 in | Wt 191.0 lb

## 2023-06-03 DIAGNOSIS — G5621 Lesion of ulnar nerve, right upper limb: Secondary | ICD-10-CM

## 2023-06-03 DIAGNOSIS — Z09 Encounter for follow-up examination after completed treatment for conditions other than malignant neoplasm: Secondary | ICD-10-CM

## 2023-06-03 NOTE — Progress Notes (Signed)
   REFERRING PHYSICIAN:  Barbette Reichmann, Md [address] [place],  [place] [address]  DOS: 04/23/23  right-sided ulnar nerve decompression at the elbow   HISTORY OF PRESENT ILLNESS: Donna Fowler is 6 weeks status post  right-sided ulnar nerve decompression at the elbow.  Ulnar nerve distributions symptoms have improved.  She does get some intermittent swelling of the elbow.  She does have Monocryl stitch that has emerged.  She does complain of some lateral elbow pain that radiates down the lateral aspect of her forearm into the dorsum of her hand.  This is worsened with activity.  This does bother her throughout the day.  Often gets worse at night  PHYSICAL EXAMINATION:  NEUROLOGICAL:  General: In no acute distress.   Awake, alert, oriented to person, place, and time.  Pupils equal round and reactive to light.  Facial tone is symmetric.    Strength: Side Biceps Triceps Deltoid Interossei Grip Wrist Ext. Wrist Flex.  R 5 5 5 5 5 5 5   L 5 5 5 5 5 5 5    Incision c/d/I, minimal swelling, small Monocryl stitch extruded  Pain at the lateral epicondyle with forced finger extension and wrist extension.  Tinel noted at the radial tunnel.  Imaging:  Nothing new to review.   Assessment / Plan: Donna Fowler is doing well s/p above surgery. Treatment options reviewed with patient and following plan made  - She increase in her work capacity. -Will plan for stitch removal -She does have a component of lateral epicondylitis which seems to be giving her referred radial tunnel pain.  Will plan to reach out to her pain provider to evaluate whether or not they would be comfortable working her in for radial tunnel injection.  Should they not be able to we can make a local referral.  Advised to contact the office if any questions or concerns arise.   Lovenia Kim, MD Department of Neurosurgery

## 2023-06-04 ENCOUNTER — Encounter: Payer: Self-pay | Admitting: Neurosurgery

## 2023-06-18 ENCOUNTER — Other Ambulatory Visit: Payer: Self-pay | Admitting: Otolaryngology

## 2023-06-18 DIAGNOSIS — G43909 Migraine, unspecified, not intractable, without status migrainosus: Secondary | ICD-10-CM | POA: Insufficient documentation

## 2023-06-18 DIAGNOSIS — G5633 Lesion of radial nerve, bilateral upper limbs: Secondary | ICD-10-CM | POA: Diagnosis not present

## 2023-06-18 DIAGNOSIS — F419 Anxiety disorder, unspecified: Secondary | ICD-10-CM | POA: Insufficient documentation

## 2023-06-18 DIAGNOSIS — H9202 Otalgia, left ear: Secondary | ICD-10-CM

## 2023-06-18 DIAGNOSIS — J45909 Unspecified asthma, uncomplicated: Secondary | ICD-10-CM | POA: Insufficient documentation

## 2023-06-25 ENCOUNTER — Ambulatory Visit
Admission: RE | Admit: 2023-06-25 | Discharge: 2023-06-25 | Disposition: A | Discharge status: Home / Self Care | Payer: Commercial Managed Care - PPO | Source: Ambulatory Visit | Attending: Otolaryngology | Admitting: Otolaryngology

## 2023-06-25 DIAGNOSIS — H9202 Otalgia, left ear: Secondary | ICD-10-CM | POA: Diagnosis not present

## 2023-07-04 ENCOUNTER — Other Ambulatory Visit: Payer: Self-pay

## 2023-07-05 ENCOUNTER — Other Ambulatory Visit: Payer: Self-pay

## 2023-07-06 ENCOUNTER — Other Ambulatory Visit: Payer: Self-pay

## 2023-07-06 MED ORDER — MOUNJARO 5 MG/0.5ML ~~LOC~~ SOAJ
5.0000 mg | SUBCUTANEOUS | 3 refills | Status: DC
  Filled 2023-07-06: qty 2, 28d supply, fill #0

## 2023-07-07 ENCOUNTER — Other Ambulatory Visit: Payer: Self-pay | Admitting: Neurosurgery

## 2023-07-07 DIAGNOSIS — Z9889 Other specified postprocedural states: Secondary | ICD-10-CM

## 2023-07-07 DIAGNOSIS — G5621 Lesion of ulnar nerve, right upper limb: Secondary | ICD-10-CM

## 2023-07-09 DIAGNOSIS — Z01419 Encounter for gynecological examination (general) (routine) without abnormal findings: Secondary | ICD-10-CM | POA: Diagnosis not present

## 2023-07-09 DIAGNOSIS — Z6834 Body mass index (BMI) 34.0-34.9, adult: Secondary | ICD-10-CM | POA: Diagnosis not present

## 2023-07-10 ENCOUNTER — Ambulatory Visit: Payer: Commercial Managed Care - PPO | Attending: Neurosurgery

## 2023-07-10 DIAGNOSIS — M25521 Pain in right elbow: Secondary | ICD-10-CM | POA: Diagnosis not present

## 2023-07-10 DIAGNOSIS — M6281 Muscle weakness (generalized): Secondary | ICD-10-CM | POA: Diagnosis not present

## 2023-07-10 DIAGNOSIS — M79601 Pain in right arm: Secondary | ICD-10-CM | POA: Insufficient documentation

## 2023-07-10 DIAGNOSIS — G5621 Lesion of ulnar nerve, right upper limb: Secondary | ICD-10-CM | POA: Insufficient documentation

## 2023-07-10 DIAGNOSIS — Z9889 Other specified postprocedural states: Secondary | ICD-10-CM | POA: Insufficient documentation

## 2023-07-10 NOTE — Therapy (Unsigned)
OUTPATIENT OCCUPATIONAL THERAPY ORTHO EVALUATION  Patient Name: Donna Fowler MRN: [id number] DOB:Jun 24, 1976, 47 y.o., female Today's Date: 07/11/2023  PCP: Dr. Barbette Reichmann REFERRING PROVIDER: Dr. Ernestine Mcmurray (Neuro surgeon)  END OF SESSION:  OT End of Session - 07/11/23 1623     Visit Number 1    Number of Visits 13    Date for OT Re-Evaluation 08/21/23    Progress Note Due on Visit 10    OT Start Time 1015    OT Stop Time 1102    OT Time Calculation (min) 47 min    Activity Tolerance Patient tolerated treatment well    Behavior During Therapy WFL for tasks assessed/performed            Past Medical History:  Diagnosis Date   Anemia    Anxiety    Arthritis    Asthma    well controlled   Carpal tunnel syndrome on both sides    Complication of anesthesia    delayed emergence   Cubital tunnel syndrome on right    Depression    Elevated hemoglobin A1c    last a1c was on 01-22-23 (5.9)   Endocervical polyp    GERD (gastroesophageal reflux disease)    Gestational diabetes    Gestational hypertension    Headache    Hypercholesterolemia    Infertility, female    Migraine    Newborn product of in vitro fertilization (IVF) pregnancy    Past Surgical History:  Procedure Laterality Date   ANTERIOR INTEROSSEOUS NERVE DECOMPRESSION Right 04/23/2023   Procedure: RIGHT CUBITAL TUNNEL DECOMPRESSION;  Surgeon: Loreen Freud, MD;  Location: [facility] ORS;  Service: Neurosurgery;  Laterality: Right;  LOCAL W/ MAC   CESAREAN SECTION N/A 04/26/2020   Procedure: CESAREAN SECTION;  Surgeon: Ranae Pila, MD;  Location: [facility] LD ORS;  Service: Obstetrics;  Laterality: N/A;   COLONOSCOPY WITH ESOPHAGOGASTRODUODENOSCOPY (EGD)  2017   COLONOSCOPY WITH PROPOFOL N/A 03/06/2022   Procedure: COLONOSCOPY WITH PROPOFOL;  Surgeon: Wyline Mood, MD;  Location: [facility] ENDOSCOPY;  Service: Gastroenterology;  Laterality: N/A;   DILATATION & CURETTAGE/HYSTEROSCOPY WITH MYOSURE N/A  06/18/2018   Procedure: DILATATION & CURETTAGE/HYSTEROSCOPY WITH MYOSURE;  Surgeon: Mitchel Honour, DO;  Location: [facility];  Service: Gynecology;  Laterality: N/A;   ELBOW ARTHROSCOPY Left 2015   ESOPHAGOGASTRODUODENOSCOPY N/A 03/06/2022   Procedure: ESOPHAGOGASTRODUODENOSCOPY (EGD);  Surgeon: Wyline Mood, MD;  Location: [facility] ENDOSCOPY;  Service: Gastroenterology;  Laterality: N/A;   excision of lymph node     axillary    LAPAROSCOPIC CHOLECYSTECTOMY  2009   PILONIDAL CYST EXCISION     Patient Active Problem List   Diagnosis Date Noted   Cubital tunnel syndrome on right 03/30/2023   Indication for care in labor or delivery 04/26/2020   ONSET DATE: 04/23/2023 (s/p decompression of ulnar nerve at R elbow)  REFERRING DIAG:  Diagnosis  G56.21 (ICD-10-CM) - Cubital tunnel syndrome on right  Z98.890 (ICD-10-CM) - S/P decompression of ulnar nerve at elbow   THERAPY DIAG:  Muscle weakness (generalized)  Pain in right elbow  Cubital tunnel syndrome on right  S/P decompression of ulnar nerve at elbow  Rationale for Evaluation and Treatment: Rehabilitation  SUBJECTIVE:  SUBJECTIVE STATEMENT: Pt reports she is seeing Dr. Nyra Capes for injections for her R elbow, with the last round being on 06/18/23, but has felt no relief of pain thus far. Pt accompanied by: self  PERTINENT HISTORY: Pt reports L ulnar nerve decompression sx in 2015, and  now the R on 04/23/2023.  Pt reports pain is still present in both elbows, though the R is more severe.  Pt reports dry needling was tried in the past with little relief, though this was attempted prior to both surgeries, but not after.  Pt verbalizes that she would like to try dry needling post surgery (PT referral requested).  Pt also reports hx of bilat carpal tunnel, which can still be aggravating.  PRECAUTIONS: None  RED FLAGS: None   WEIGHT BEARING RESTRICTIONS: No  PAIN:  Are you having pain? Yes: NPRS scale: 6-7 at rest,  8-9/10 Pain location: bilat elbows (L lateral elbow, R lateral and medial elbow, bilat triceps tendon and forearm extensors all tender to palpate Pain description: stabbing at times, throbbing, tender  Aggravating factors: using the arms, lifting, pulling, palpation to sore areas noted above Relieving factors: ice, heat, pain meds, rest  LIVING ENVIRONMENT: Lives with: lives with their family, spouse and 85 y/o son Has following equipment at home:  R elbow brace, bilat carpal tunnel braces, bilat compression sleeves for the elbows , TENS unit (but has not recently used)  PLOF: Independent with ADL/IADLs, full time OR nurse at [employer].  Pt works 10 hour shifts.    PATIENT GOALS: Reduce/eliminate pain in bilat elbows  NEXT MD VISIT: "As needed"  OBJECTIVE:  Note: Objective measures were completed at Evaluation unless otherwise noted.  HAND DOMINANCE: Right  ADLs: Overall ADLs: Frequent dropping of ADL supplies Transfers/ambulation related to ADLs: indep Eating: able to cut food without difficulty Grooming: pain/fatigue with holding a hair dryer UB Dressing: indep LB Dressing: indep Toileting: indep Bathing: pain in elbows when washing/drying hair  FUNCTIONAL OUTCOME MEASURES: FOTO: 57; predicted 65  UPPER EXTREMITY ROM:  WNL BUEs  UPPER EXTREMITY MMT:     MMT Right eval Left eval  Shoulder flexion 5 5  Shoulder abduction 5 5  Shoulder adduction    Shoulder extension    Shoulder internal rotation    Shoulder external rotation    Middle trapezius    Lower trapezius    Elbow flexion 4+ 4+  Elbow extension 4+ 4+  Wrist flexion 4+ 4+  Wrist extension 4+ 4+  Wrist ulnar deviation    Wrist radial deviation    Wrist pronation    Wrist supination    (Blank rows = not tested)  HAND FUNCTION: Grip strength: Right: 52 lbs; Left: 62 lbs, Lateral pinch: Right: 16 lbs, Left: 21 lbs, and 3 point pinch: Right: 17 lbs, Left: 19 lbs  COORDINATION: WNL  SENSATION: Intermittent  tingling/numbness R hand ulnar nerve distribution  EDEMA: No visible edema  COGNITION: Overall cognitive status: Within functional limits for tasks assessed  OBSERVATIONS:  Pt pleasant, cooperative, eager to begin tx to reduce pain, receptive to tx plan and education provided during session  R medial elbow surgical scar: slightly raised, pink, well approximated  TODAY'S TREATMENT:  DATE: 07/10/23 Evaluation completed.  Self Care: Initiated education on avoidance of prolonged elbow flexion.  Recommendation made to obtain athletic knee/elbow pads for night time elbow bracing.  Instructed hard pad should be positioned on volar elbow for night time to avoid elbow flexion while sleeping.  Education provided for contrast bath and benefits for pain/edema reduction.  Written handout for contrast bath technique provided and reviewed.  Encouraged pt complete at home over the weekend.  Pt agreeable.  PATIENT EDUCATION: Education details: OT role, goals, poc; contrast bath, elbow splint Person educated: Patient Education method: Explanation, Demonstration, Verbal cues, and Handouts Education comprehension: verbalized understanding and needs further education  HOME EXERCISE PROGRAM: Contrast bath  GOALS: Goals reviewed with patient? Yes  SHORT TERM GOALS: Target date: 08/01/23  Pt will be indep to perform HEP for improving R hand strength and bilat elbow and wrist flex/ext strength for ADL/work tasks. Baseline: Eval: HEP not yet initiated Goal status: INITIAL  2.  Pt will be indep to verbalize 2-3 joint protection/cumulative trauma prevention strategies to reduce pain/paresthesias in BUEs.  Baseline: Eval: initiated positioning education at eval Goal status: INITIAL  LONG TERM GOALS: Target date: 08/21/23  Pt will increase FOTO score to 65 or better to indicate  improvement in self perceived functional use of the R arm with daily tasks. Baseline: Eval: 57 Goal status: INITIAL  2.  Pt will increase R grip strength by 10 or more lbs to achieve near age related norms for dominant hand, allowing improved tolerance for lifting/carrying heavy ADL supplies in R dominant hand. Baseline: Eval: R hand 52 lbs (L hand 62 lbs, non-dominant) Goal status: INITIAL  3.  Pt will tolerate manual therapy, therapeutic modalities, and exercises to decrease pain in R elbow to a reported 2/10 pain or less at rest, 4/10 pain or less with activity.  Baseline: Eval: R elbow pain 6-7/10 pain at rest, 8-9/10 pain with activity Goal status: INITIAL  ASSESSMENT: CLINICAL IMPRESSION: Patient is a 47 y.o. female who was seen today for occupational therapy evaluation for R cubital tunnel syndrome, s/p ulnar nerve decompression at the elbow on 04/23/23.  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  Pt will benefit from skilled OT to provide education on joint protection/cumulative trauma prevention, activity modification, AE, manual therapy, modalities for pain management, possible splinting, and instruction in HEP in order to reduce symptoms and maximize tolerance for work and home activities.  Pt had anticipated dry needling, though this is completed by PT at this location.  Requested PT referral for dry needling.  Pt in agreement for OT x2 weeks for a trial of symptom management, while waiting for PT scheduling, and/or simultaneous to PT treatment program.  Extended OT date past 2 weeks to allow continuation of OT if pt finds treatments effective.    PERFORMANCE DEFICITS: in functional skills including ADLs, IADLs, sensation,  edema, strength, pain, fascial restrictions, body mechanics, decreased knowledge of precautions, decreased knowledge of use of DME, skin integrity, and UE functional use, and psychosocial skills including coping strategies, environmental adaptation, habits, and routines and behaviors.   IMPAIRMENTS: are limiting patient from ADLs, IADLs, rest and sleep, and work.   COMORBIDITIES: has co-morbidities such as bilat  carpal tunnel syndrome, L ulnar nerve decompression  that affects occupational performance. Patient will benefit from skilled OT to address above impairments and improve overall function.  MODIFICATION OR ASSISTANCE TO COMPLETE EVALUATION: No modification of tasks or assist necessary to complete an evaluation.  OT OCCUPATIONAL PROFILE AND HISTORY: Problem focused assessment: Including review of records relating to presenting problem.  CLINICAL DECISION MAKING: Moderate - several treatment options, min-mod task modification necessary  REHAB POTENTIAL: Good  EVALUATION COMPLEXITY: Moderate      PLAN:  OT FREQUENCY: 2x/week  OT DURATION: 6 weeks  PLANNED INTERVENTIONS: 97168 OT Re-evaluation, 97535 self care/ADL training, 30865 therapeutic exercise, 97530 therapeutic activity, 97140 manual therapy, 97035 ultrasound, 97018 paraffin, 78469 moist heat, 97010 cryotherapy, 97034 contrast bath, 97032 electrical stimulation (manual), 97014 electrical stimulation unattended, 97760 Orthotics management and training, 62952 Splinting (initial encounter), [id number] Subsequent splinting/medication, scar mobilization, passive range of motion, coping strategies training, patient/family education, and DME and/or AE instructions  RECOMMENDED OTHER SERVICES: Dry Needling with PT  CONSULTED AND AGREED WITH PLAN OF CARE: Patient  PLAN FOR NEXT SESSION: Manual therapy/moist heat/contrast bath  Danelle Earthly, MS, OTR/L  Otis Dials, OT 07/11/2023, 4:26 PM

## 2023-07-15 ENCOUNTER — Ambulatory Visit: Payer: Commercial Managed Care - PPO

## 2023-07-15 DIAGNOSIS — M25521 Pain in right elbow: Secondary | ICD-10-CM | POA: Diagnosis not present

## 2023-07-15 DIAGNOSIS — Z9889 Other specified postprocedural states: Secondary | ICD-10-CM

## 2023-07-15 DIAGNOSIS — M6281 Muscle weakness (generalized): Secondary | ICD-10-CM

## 2023-07-15 DIAGNOSIS — G5621 Lesion of ulnar nerve, right upper limb: Secondary | ICD-10-CM

## 2023-07-15 DIAGNOSIS — M79601 Pain in right arm: Secondary | ICD-10-CM | POA: Diagnosis not present

## 2023-07-16 ENCOUNTER — Ambulatory Visit: Payer: Commercial Managed Care - PPO

## 2023-07-16 DIAGNOSIS — Z9889 Other specified postprocedural states: Secondary | ICD-10-CM

## 2023-07-16 DIAGNOSIS — M25521 Pain in right elbow: Secondary | ICD-10-CM

## 2023-07-16 DIAGNOSIS — M6281 Muscle weakness (generalized): Secondary | ICD-10-CM

## 2023-07-16 DIAGNOSIS — G5621 Lesion of ulnar nerve, right upper limb: Secondary | ICD-10-CM | POA: Diagnosis not present

## 2023-07-16 DIAGNOSIS — M79601 Pain in right arm: Secondary | ICD-10-CM | POA: Diagnosis not present

## 2023-07-17 ENCOUNTER — Ambulatory Visit: Payer: Commercial Managed Care - PPO

## 2023-07-17 DIAGNOSIS — M79601 Pain in right arm: Secondary | ICD-10-CM

## 2023-07-17 DIAGNOSIS — Z9889 Other specified postprocedural states: Secondary | ICD-10-CM | POA: Diagnosis not present

## 2023-07-17 DIAGNOSIS — M6281 Muscle weakness (generalized): Secondary | ICD-10-CM | POA: Diagnosis not present

## 2023-07-17 DIAGNOSIS — G5621 Lesion of ulnar nerve, right upper limb: Secondary | ICD-10-CM | POA: Diagnosis not present

## 2023-07-17 DIAGNOSIS — M25521 Pain in right elbow: Secondary | ICD-10-CM | POA: Diagnosis not present

## 2023-07-17 NOTE — Therapy (Signed)
OUTPATIENT OCCUPATIONAL THERAPY ORTHO TREATMENT NOTE  Patient Name: Donna Fowler MRN: [id number] DOB:Nov 01, 1975, 47 y.o., female Today's Date: 07/17/2023  PCP: Dr. Barbette Reichmann REFERRING PROVIDER: Dr. Ernestine Mcmurray (Neuro surgeon)  END OF SESSION:  OT End of Session - 07/17/23 0905     Visit Number 3    Number of Visits 12    Date for OT Re-Evaluation 08/21/23    Progress Note Due on Visit 10    OT Start Time 1320    OT Stop Time 1400    OT Time Calculation (min) 40 min    Activity Tolerance Patient tolerated treatment well    Behavior During Therapy WFL for tasks assessed/performed               Past Medical History:  Diagnosis Date   Anemia    Anxiety    Arthritis    Asthma    well controlled   Carpal tunnel syndrome on both sides    Complication of anesthesia    delayed emergence   Cubital tunnel syndrome on right    Depression    Elevated hemoglobin A1c    last a1c was on 01-22-23 (5.9)   Endocervical polyp    GERD (gastroesophageal reflux disease)    Gestational diabetes    Gestational hypertension    Headache    Hypercholesterolemia    Infertility, female    Migraine    Newborn product of in vitro fertilization (IVF) pregnancy    Past Surgical History:  Procedure Laterality Date   ANTERIOR INTEROSSEOUS NERVE DECOMPRESSION Right 04/23/2023   Procedure: RIGHT CUBITAL TUNNEL DECOMPRESSION;  Surgeon: Loreen Freud, MD;  Location: [facility] ORS;  Service: Neurosurgery;  Laterality: Right;  LOCAL W/ MAC   CESAREAN SECTION N/A 04/26/2020   Procedure: CESAREAN SECTION;  Surgeon: Ranae Pila, MD;  Location: [facility] LD ORS;  Service: Obstetrics;  Laterality: N/A;   COLONOSCOPY WITH ESOPHAGOGASTRODUODENOSCOPY (EGD)  2017   COLONOSCOPY WITH PROPOFOL N/A 03/06/2022   Procedure: COLONOSCOPY WITH PROPOFOL;  Surgeon: Wyline Mood, MD;  Location: [facility] ENDOSCOPY;  Service: Gastroenterology;  Laterality: N/A;   DILATATION & CURETTAGE/HYSTEROSCOPY WITH  MYOSURE N/A 06/18/2018   Procedure: DILATATION & CURETTAGE/HYSTEROSCOPY WITH MYOSURE;  Surgeon: Mitchel Honour, DO;  Location: [facility];  Service: Gynecology;  Laterality: N/A;   ELBOW ARTHROSCOPY Left 2015   ESOPHAGOGASTRODUODENOSCOPY N/A 03/06/2022   Procedure: ESOPHAGOGASTRODUODENOSCOPY (EGD);  Surgeon: Wyline Mood, MD;  Location: [facility] ENDOSCOPY;  Service: Gastroenterology;  Laterality: N/A;   excision of lymph node     axillary    LAPAROSCOPIC CHOLECYSTECTOMY  2009   PILONIDAL CYST EXCISION     Patient Active Problem List   Diagnosis Date Noted   Cubital tunnel syndrome on right 03/30/2023   Indication for care in labor or delivery 04/26/2020   ONSET DATE: 04/23/2023 (s/p decompression of ulnar nerve at R elbow)  REFERRING DIAG:  Diagnosis  G56.21 (ICD-10-CM) - Cubital tunnel syndrome on right  Z98.890 (ICD-10-CM) - S/P decompression of ulnar nerve at elbow   THERAPY DIAG:  Muscle weakness (generalized)  Pain in right elbow  Cubital tunnel syndrome on right  S/P decompression of ulnar nerve at elbow  Rationale for Evaluation and Treatment: Rehabilitation  SUBJECTIVE:  SUBJECTIVE STATEMENT: Pt reports she did have some increased soreness in her arms yesterday, as anticipated following manual therapy, but nothing that prevented her from performing any work or home tasks. Pt accompanied by: self  PERTINENT HISTORY: Pt reports L ulnar nerve decompression  sx in 2015, and now the R on 04/23/2023.  Pt reports pain is still present in both elbows, though the R is more severe.  Pt reports dry needling was tried in the past with little relief, though this was attempted prior to both surgeries, but not after.  Pt verbalizes that she would like to try dry needling post surgery (PT referral requested).  Pt also reports hx of bilat carpal tunnel, which can still be aggravating.  PRECAUTIONS: None  RED FLAGS: None   WEIGHT BEARING RESTRICTIONS: No  PAIN:  Are you  having pain? Yes: NPRS scale: 6-7 at rest, 8-9/10 Pain location: bilat elbows (L lateral elbow, R lateral and medial elbow, bilat triceps tendon and forearm extensors all tender to palpate Pain description: stabbing at times, throbbing, tender  Aggravating factors: using the arms, lifting, pulling, palpation to sore areas noted above Relieving factors: ice, heat, pain meds, rest  LIVING ENVIRONMENT: Lives with: lives with their family, spouse and 74 y/o son Has following equipment at home:  R elbow brace, bilat carpal tunnel braces, bilat compression sleeves for the elbows , TENS unit (but has not recently used)  PLOF: Independent with ADL/IADLs, full time OR nurse at [employer].  Pt works 10 hour shifts.    PATIENT GOALS: Reduce/eliminate pain in bilat elbows  NEXT MD VISIT: "As needed"  OBJECTIVE:  Note: Objective measures were completed at Evaluation unless otherwise noted.  HAND DOMINANCE: Right  ADLs: Overall ADLs: Frequent dropping of ADL supplies Transfers/ambulation related to ADLs: indep Eating: able to cut food without difficulty Grooming: pain/fatigue with holding a hair dryer UB Dressing: indep LB Dressing: indep Toileting: indep Bathing: pain in elbows when washing/drying hair  FUNCTIONAL OUTCOME MEASURES: FOTO: 57; predicted 65  UPPER EXTREMITY ROM:  WNL BUEs  UPPER EXTREMITY MMT:     MMT Right eval Left eval  Shoulder flexion 5 5  Shoulder abduction 5 5  Shoulder adduction    Shoulder extension    Shoulder internal rotation    Shoulder external rotation    Middle trapezius    Lower trapezius    Elbow flexion 4+ 4+  Elbow extension 4+ 4+  Wrist flexion 4+ 4+  Wrist extension 4+ 4+  Wrist ulnar deviation    Wrist radial deviation    Wrist pronation    Wrist supination    (Blank rows = not tested)  HAND FUNCTION: Grip strength: Right: 52 lbs; Left: 62 lbs, Lateral pinch: Right: 16 lbs, Left: 21 lbs, and 3 point pinch: Right: 17 lbs, Left: 19  lbs  COORDINATION: WNL  SENSATION: Intermittent tingling/numbness R hand ulnar nerve distribution  EDEMA: No visible edema  COGNITION: Overall cognitive status: Within functional limits for tasks assessed  OBSERVATIONS:  Pt pleasant, cooperative, eager to begin tx to reduce pain, receptive to tx plan and education provided during session  R medial elbow surgical scar: slightly raised, pink, well approximated  TODAY'S TREATMENT:  DATE: 07/16/23 Moist heat for pain management/muscle relaxation used throughout session, applied to L elbow simultaneous to manual therapy to the RUE, alternating to moist heat on the R elbow simultaneous to manual therapy to the LUE.    Manual Therapy: Performed scar massage to R medial elbow, working to reduce sensitivity, flatten, and mobilize tissue beneath to decrease risk of developing scar adhesions.  Performed soft tissue massage at the medial and lateral elbows bilaterally, working to reduce pain and increase circulation to tender areas.  Utilized Graston tool for BUEs for fascial release on the volar and dorsal forearms, medial and lateral elbows, and triceps.  Good tolerance throughout.  PATIENT EDUCATION: Education details: HEP, ice/contrast bath/review of joint protection strategies Person educated: Patient Education method: Explanation Education comprehension: verbalized understanding  HOME EXERCISE PROGRAM: Contrast bath, theraputty, BUE stretching  GOALS: Goals reviewed with patient? Yes  SHORT TERM GOALS: Target date: 08/01/23  Pt will be indep to perform HEP for improving R hand strength and bilat elbow and wrist flex/ext strength for ADL/work tasks. Baseline: Eval: HEP not yet initiated Goal status: INITIAL  2.  Pt will be indep to verbalize 2-3 joint protection/cumulative trauma prevention strategies to reduce  pain/paresthesias in BUEs.  Baseline: Eval: initiated positioning education at eval Goal status: INITIAL  LONG TERM GOALS: Target date: 08/21/23  Pt will increase FOTO score to 65 or better to indicate improvement in self perceived functional use of the R arm with daily tasks. Baseline: Eval: 57 Goal status: INITIAL  2.  Pt will increase R grip strength by 10 or more lbs to achieve near age related norms for dominant hand, allowing improved tolerance for lifting/carrying heavy ADL supplies in R dominant hand. Baseline: Eval: R hand 52 lbs (L hand 62 lbs, non-dominant) Goal status: INITIAL  3.  Pt will tolerate manual therapy, therapeutic modalities, and exercises to decrease pain in R elbow to a reported 2/10 pain or less at rest, 4/10 pain or less with activity.  Baseline: Eval: R elbow pain 6-7/10 pain at rest, 8-9/10 pain with activity Goal status: INITIAL  ASSESSMENT: CLINICAL IMPRESSION: Pt with good tolerance to manual therapy this date and verbalized liking the Graston tool for fascial release.  Continued encouragement for icing or contrast bath at home to manage pain in elbows following a work shift, continued performance of daily stretches, and continued encouragement to be mindful of joint protection/cumulative trauma prevention strategies with work and home tasks.  Pt verbalized understanding.  Pt will have PT eval tomorrow to begin dry needling.  Will plan to collaborate with PT following evaluation tomorrow.  Pt will continue to benefit from skilled OT to address pain management, further develop HEP, and continue to review joint protection/cumulative trauma prevention strategies in order to reduce symptoms during work and home tasks.  PERFORMANCE DEFICITS: in functional skills including ADLs, IADLs, sensation, edema, strength, pain, fascial restrictions, body mechanics, decreased knowledge of precautions, decreased knowledge of use of DME, skin integrity, and UE functional use, and  psychosocial skills including coping strategies, environmental adaptation, habits, and routines and behaviors.   IMPAIRMENTS: are limiting patient from ADLs, IADLs, rest and sleep, and work.   COMORBIDITIES: has co-morbidities such as bilat carpal tunnel syndrome, L ulnar nerve decompression  that affects occupational performance. Patient will benefit from skilled OT to address above impairments and improve overall function.  MODIFICATION OR ASSISTANCE TO COMPLETE EVALUATION: No modification of tasks or assist necessary to complete an evaluation.  OT OCCUPATIONAL PROFILE AND HISTORY:  Problem focused assessment: Including review of records relating to presenting problem.  CLINICAL DECISION MAKING: Moderate - several treatment options, min-mod task modification necessary  REHAB POTENTIAL: Good  EVALUATION COMPLEXITY: Moderate      PLAN:  OT FREQUENCY: 2x/week  OT DURATION: 6 weeks  PLANNED INTERVENTIONS: 97168 OT Re-evaluation, 97535 self care/ADL training, 16109 therapeutic exercise, 97530 therapeutic activity, 97140 manual therapy, 97035 ultrasound, 97018 paraffin, 60454 moist heat, 97010 cryotherapy, 97034 contrast bath, 97032 electrical stimulation (manual), 97014 electrical stimulation unattended, 97760 Orthotics management and training, 09811 Splinting (initial encounter), [id number] Subsequent splinting/medication, scar mobilization, passive range of motion, coping strategies training, patient/family education, and DME and/or AE instructions  RECOMMENDED OTHER SERVICES: Dry Needling with PT  CONSULTED AND AGREED WITH PLAN OF CARE: Patient  PLAN FOR NEXT SESSION: Manual therapy/moist heat/contrast bath  Danelle Earthly, MS, OTR/L  [employer] Dials, OT 07/17/2023, 9:06 AM

## 2023-07-17 NOTE — Therapy (Signed)
OUTPATIENT PHYSICAL THERAPY UPPER EXTREMITY EVALUATION   Patient Name: Donna Fowler MRN: [id number] DOB:May 03, 1976, 47 y.o., female Today's Date: 07/20/2023  END OF SESSION:  PT End of Session - 07/20/23 1443     Visit Number 1    Number of Visits 16    Date for PT Re-Evaluation 09/11/23    Progress Note Due on Visit 10    PT Start Time 0800    PT Stop Time 0844    PT Time Calculation (min) 44 min    Activity Tolerance Patient tolerated treatment well;No increased pain    Behavior During Therapy WFL for tasks assessed/performed             Past Medical History:  Diagnosis Date   Anemia    Anxiety    Arthritis    Asthma    well controlled   Carpal tunnel syndrome on both sides    Complication of anesthesia    delayed emergence   Cubital tunnel syndrome on right    Depression    Elevated hemoglobin A1c    last a1c was on 01-22-23 (5.9)   Endocervical polyp    GERD (gastroesophageal reflux disease)    Gestational diabetes    Gestational hypertension    Headache    Hypercholesterolemia    Infertility, female    Migraine    Newborn product of in vitro fertilization (IVF) pregnancy    Past Surgical History:  Procedure Laterality Date   ANTERIOR INTEROSSEOUS NERVE DECOMPRESSION Right 04/23/2023   Procedure: RIGHT CUBITAL TUNNEL DECOMPRESSION;  Surgeon: Loreen Freud, MD;  Location: [facility] ORS;  Service: Neurosurgery;  Laterality: Right;  LOCAL W/ MAC   CESAREAN SECTION N/A 04/26/2020   Procedure: CESAREAN SECTION;  Surgeon: Ranae Pila, MD;  Location: [facility] LD ORS;  Service: Obstetrics;  Laterality: N/A;   COLONOSCOPY WITH ESOPHAGOGASTRODUODENOSCOPY (EGD)  2017   COLONOSCOPY WITH PROPOFOL N/A 03/06/2022   Procedure: COLONOSCOPY WITH PROPOFOL;  Surgeon: Wyline Mood, MD;  Location[facility]COPY;  Service: Gastroenterology;  Laterality: N/A;   DILATATION & CURETTAGE/HYSTEROSCOPY WITH MYOSURE N/A 06/18/2018   Procedure: DILATATION &  CURETTAGE/HYSTEROSCOPY WITH MYOSURE;  Surgeon: Mitchel Honour, DO;  Location: [facility];  Service: Gynecology;  Laterality: N/A;   ELBOW ARTHROSCOPY Left 2015   ESOPHAGOGASTRODUODENOSCOPY N/A 03/06/2022   Procedure: ESOPHAGOGASTRODUODENOSCOPY (EGD);  Surgeon: Wyline Mood, MD;  Location: [facility] ENDOSCOPY;  Service: Gastroenterology;  Laterality: N/A;   excision of lymph node     axillary    LAPAROSCOPIC CHOLECYSTECTOMY  2009   PILONIDAL CYST EXCISION     Patient Active Problem List   Diagnosis Date Noted   Cubital tunnel syndrome on right 03/30/2023   Indication for care in labor or delivery 04/26/2020    PCP: Dr. Cora Daniels  REFERRING PROVIDER: Dr. Ernestine Mcmurray  REFERRING DIAG:  G56.21 (ICD-10-CM) - Lesion of ulnar nerve, right upper limb  Z98.890 (ICD-10-CM) - Other specified postprocedural states    THERAPY DIAG:  Pain in right arm  Muscle weakness (generalized)  Rationale for Evaluation and Treatment: Rehabilitation  ONSET DATE: 04/23/2023  SUBJECTIVE:  SUBJECTIVE STATEMENT: Patient reports having bilateral forearm pain with nerve pain and some wrist/hand weakness. She reports ongoing numbness/tingling/pain in right arm that is worse after prolonged use of arm and working.    Hand dominance: Right  PERTINENT HISTORY: Patient is s/p right ulnar nerve decompression procedure on 04/23/2023 and hx of bilateral Carpal tunnel surgery. Patient with previous left ulnar nerve surgery. Patient requesting Dry needling as other interventions have not been successful.   PAIN:  Are you having pain? Yes: NPRS scale: current 5/10; worst =10/10; best= 3/10 Pain location: lateral radial right> left Pain description: achy, can be sharp Aggravating factors: continuous activities- vacuuming, pulling,  heavy lifting Relieving factors: compression sleeve, topical ointments, stretching, ice/heat, Tylenol/ibuprofen, neurotin  PRECAUTIONS: None  RED FLAGS: None   WEIGHT BEARING RESTRICTIONS: No  FALLS:  Has patient fallen in last 6 months? 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  NEXT MD VISIT: ?  OBJECTIVE:  Note: Objective measures were completed at Evaluation unless otherwise noted.  DIAGNOSTIC FINDINGS:  None available  PATIENT SURVEYS :  FOTO 50 with goal of 42  COGNITION: Overall cognitive status: Within functional limits for tasks assessed     SENSATION: Light touch: Impaired - decreased right forearm- lateral and medial  POSTURE: Forward head, rounded shoulders   UPPER EXTREMITY MMT:  MMT Right eval Left eval  Shoulder flexion 5 5  Shoulder extension 5 5  Shoulder abduction 5 5  Shoulder adduction    Shoulder internal rotation 5 5  Shoulder external rotation 5 5  Middle trapezius    Lower trapezius    Elbow flexion 4 4+  Elbow extension 4 4+  Wrist flexion 4 4+  Wrist extension 4 4+  Wrist ulnar deviation 4 4  Wrist radial deviation 4 4  Wrist pronation 4 4+  Wrist supination 4 4+  Grip strength (lbs) 34 47  (Blank rows = not tested)    JOINT MOBILITY TESTING:   Not tested  PALPATION:  Point tenderness along forearm extensors  on R> L    TODAY'S TREATMENT:                                                                                                                                         DATE: 10/25 PT evaluation  TDN Treatment: (Unbilled)  Patient consent: After explanation of TDN Rationale, Procedures, outcomes, and  potential side effects, patient verbalized consent to TDN treatment. Region/Dx: Right and left forearm pain Muscles Treated: R &L forearm extensors using 0.25. 40 needles Post treatment pain/response: No adverse reaction to DN to either arm today.  Post treatment Instructions: Patient instructed to expect mild to moderate muscle soreness this evening and tomorrow. Patient instructed to continued prescribed home exercise program. Patient verbalized understanding of these instructions.   PATIENT EDUCATION: Education details: PT plan of care; Rationale for DN for pain relief/hypomobility Person educated: Patient Education method: Explanation Education comprehension: verbalized understanding  HOME EXERCISE PROGRAM: To be initiated next 1-2 visits  ASSESSMENT:  CLINICAL IMPRESSION: Patient is a 47  y.o. female who was seen today for physical therapy evaluation and treatment for s/p right ulnar nerve decompression on 04/23/2023. She presents today with good BUE AROM yet some obvious elbow/forearm weakness. She  presents with lateral forearm pain and some ulnar nerve paresthesias  into right hand. She endorses ongoing pain and difficulty performing job duties at times. She responded well initially to dry needling today and may benefit from skilled PT services to improve pain relief, improve bilateral UE muscle strength to return to previous level of function in home and at work.    OBJECTIVE IMPAIRMENTS: decreased strength, hypomobility, impaired UE functional use, and pain.   ACTIVITY LIMITATIONS: carrying, lifting, dressing, and working  PARTICIPATION LIMITATIONS: occupation, yard work, and ADL's  PERSONAL FACTORS: 1-2 comorbidities: Previous surgery left arm and    are also affecting patient's functional outcome.   REHAB POTENTIAL: Good  CLINICAL DECISION MAKING: Stable/uncomplicated  EVALUATION COMPLEXITY: Low  GOALS: Goals reviewed with patient? Yes  SHORT TERM GOALS: Target date:  08/14/2023  Pt will be independent with HEP in order to improve strength and balance in order to decrease fall risk and improve function at home and work.  Baseline: EVAL = No formal HEP in place Goal status: INITIAL  LONG TERM GOALS: Target date: 09/11/2023  Pt will improve FOTO to target score of 57 to display perceived improvements in functional use of RUE and ability to complete ADL's.  Baseline: EVAL= 50 Goal status: INITIAL  2.  Pt will decrease worst pain  in right arm as reported on NPRS by at least 3 points in order to demonstrate clinically significant reduction in pain. Baseline: EVAL= 10/10 at worst Goal status: INITIAL  3.  Pt will increase Right elbow/forearm strength of  by at least 1/2 MMT grade in order to demonstrate improvement in strength and function  Baseline: EVAL= Right elbow flex/ext and forearm pronation/supination= 4/5 Goal status: INITIAL   PLAN: PT FREQUENCY: 1-2x/week  PT DURATION: 8 weeks  PLANNED INTERVENTIONS: 97164- PT Re-evaluation, 97110-Therapeutic exercises, 97530- Therapeutic activity, 97112- Neuromuscular re-education, 97535- Self Care, 16109- Manual therapy, [id number]- Electrical stimulation (manual), 97035- Ultrasound, Patient/Family education, Taping, Dry Needling, Joint mobilization, Joint manipulation, Scar mobilization, Compression bandaging, Cryotherapy, and Moist heat  PLAN FOR NEXT SESSION: Manual therapy for Pain and ROM, Dry needling for pain and hypomobility, Instruct in therex including stretching and add to HEP as appropriate.    Lenda Kelp, PT 07/20/2023, 9:42 PM

## 2023-07-17 NOTE — Therapy (Addendum)
OUTPATIENT OCCUPATIONAL THERAPY ORTHO TREATMENT NOTE  Patient Name: Donna Fowler MRN: [id number] DOB:[birth year], 47 y.o., female Today's Date: 07/17/2023  PCP: Dr. Barbette Reichmann REFERRING PROVIDER: Dr. Ernestine Mcmurray (Neuro surgeon)  END OF SESSION:  OT End of Session - 07/17/23 0832     Visit Number 2    Number of Visits 12    Date for OT Re-Evaluation 08/21/23    Progress Note Due on Visit 10    OT Start Time 1315    OT Stop Time 1400    OT Time Calculation (min) 45 min    Activity Tolerance Patient tolerated treatment well    Behavior During Therapy WFL for tasks assessed/performed               Past Medical History:  Diagnosis Date   Anemia    Anxiety    Arthritis    Asthma    well controlled   Carpal tunnel syndrome on both sides    Complication of anesthesia    delayed emergence   Cubital tunnel syndrome on right    Depression    Elevated hemoglobin A1c    last a1c was on 01-22-23 (5.9)   Endocervical polyp    GERD (gastroesophageal reflux disease)    Gestational diabetes    Gestational hypertension    Headache    Hypercholesterolemia    Infertility, female    Migraine    Newborn product of in vitro fertilization (IVF) pregnancy    Past Surgical History:  Procedure Laterality Date   ANTERIOR INTEROSSEOUS NERVE DECOMPRESSION Right 04/23/2023   Procedure: RIGHT CUBITAL TUNNEL DECOMPRESSION;  Surgeon: Loreen Freud, MD;  Location: [facility] ORS;  Service: Neurosurgery;  Laterality: Right;  LOCAL W/ MAC   CESAREAN SECTION N/A 04/26/2020   Procedure: CESAREAN SECTION;  Surgeon: Ranae Pila, MD;  Location: [facility] LD ORS;  Service: Obstetrics;  Laterality: N/A;   COLONOSCOPY WITH ESOPHAGOGASTRODUODENOSCOPY (EGD)  2017   COLONOSCOPY WITH PROPOFOL N/A 03/06/2022   Procedure: COLONOSCOPY WITH PROPOFOL;  Surgeon: Wyline Mood, MD;  Location: [facility] ENDOSCOPY;  Service: Gastroenterology;  Laterality: N/A;   DILATATION & CURETTAGE/HYSTEROSCOPY WITH  MYOSURE N/A 06/18/2018   Procedure: DILATATION & CURETTAGE/HYSTEROSCOPY WITH MYOSURE;  Surgeon: Mitchel Honour, DO;  Location: [facility];  Service: Gynecology;  Laterality: N/A;   ELBOW ARTHROSCOPY Left 2015   ESOPHAGOGASTRODUODENOSCOPY N/A 03/06/2022   Procedure: ESOPHAGOGASTRODUODENOSCOPY (EGD);  Surgeon: Wyline Mood, MD;  Location: [facility] ENDOSCOPY;  Service: Gastroenterology;  Laterality: N/A;   excision of lymph node     axillary    LAPAROSCOPIC CHOLECYSTECTOMY  2009   PILONIDAL CYST EXCISION     Patient Active Problem List   Diagnosis Date Noted   Cubital tunnel syndrome on right 03/30/2023   Indication for care in labor or delivery 04/26/2020   ONSET DATE: 04/23/2023 (s/p decompression of ulnar nerve at R elbow)  REFERRING DIAG:  Diagnosis  G56.21 (ICD-10-CM) - Cubital tunnel syndrome on right  Z98.890 (ICD-10-CM) - S/P decompression of ulnar nerve at elbow   THERAPY DIAG:  Muscle weakness (generalized)  Pain in right elbow  Cubital tunnel syndrome on right  S/P decompression of ulnar nerve at elbow  Rationale for Evaluation and Treatment: Rehabilitation  SUBJECTIVE:  SUBJECTIVE STATEMENT: Pt reports she hasn't done the contrast bath for her BUEs yet, but will plan to.  Pt accompanied by: self  PERTINENT HISTORY: Pt reports L ulnar nerve decompression sx in 2015, and now the R on 04/23/2023.  Pt reports  pain is still present in both elbows, though the R is more severe.  Pt reports dry needling was tried in the past with little relief, though this was attempted prior to both surgeries, but not after.  Pt verbalizes that she would like to try dry needling post surgery (PT referral requested).  Pt also reports hx of bilat carpal tunnel, which can still be aggravating.  PRECAUTIONS: None  RED FLAGS: None   WEIGHT BEARING RESTRICTIONS: No  PAIN:  Are you having pain? Yes: NPRS scale: 6-7 at rest, 8-9/10 Pain location: bilat elbows (L lateral elbow, R  lateral and medial elbow, bilat triceps tendon and forearm extensors all tender to palpate Pain description: stabbing at times, throbbing, tender  Aggravating factors: using the arms, lifting, pulling, palpation to sore areas noted above Relieving factors: ice, heat, pain meds, rest  LIVING ENVIRONMENT: Lives with: lives with their family, spouse and 55 y/o son Has following equipment at home:  R elbow brace, bilat carpal tunnel braces, bilat compression sleeves for the elbows , TENS unit (but has not recently used)  PLOF: Independent with ADL/IADLs, 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    PATIENT GOALS: Reduce/eliminate pain in bilat elbows  NEXT MD VISIT: "As needed"  OBJECTIVE:  Note: Objective measures were completed at Evaluation unless otherwise noted.  HAND DOMINANCE: Right  ADLs: Overall ADLs: Frequent dropping of ADL supplies Transfers/ambulation related to ADLs: indep Eating: able to cut food without difficulty Grooming: pain/fatigue with holding a hair dryer UB Dressing: indep LB Dressing: indep Toileting: indep Bathing: pain in elbows when washing/drying hair  FUNCTIONAL OUTCOME MEASURES: FOTO: 57; predicted 65  UPPER EXTREMITY ROM:  WNL BUEs  UPPER EXTREMITY MMT:     MMT Right eval Left eval  Shoulder flexion 5 5  Shoulder abduction 5 5  Shoulder adduction    Shoulder extension    Shoulder internal rotation    Shoulder external rotation    Middle trapezius    Lower trapezius    Elbow flexion 4+ 4+  Elbow extension 4+ 4+  Wrist flexion 4+ 4+  Wrist extension 4+ 4+  Wrist ulnar deviation    Wrist radial deviation    Wrist pronation    Wrist supination    (Blank rows = not tested)  HAND FUNCTION: Grip strength: Right: 52 lbs; Left: 62 lbs, Lateral pinch: Right: 16 lbs, Left: 21 lbs, and 3 point pinch: Right: 17 lbs, Left: 19 lbs  COORDINATION: WNL  SENSATION: Intermittent tingling/numbness R hand ulnar nerve  distribution  EDEMA: No visible edema  COGNITION: Overall cognitive status: Within functional limits for tasks assessed  OBSERVATIONS:  Pt pleasant, cooperative, eager to begin tx to reduce pain, receptive to tx plan and education provided during session  R medial elbow surgical scar: slightly raised, pink, well approximated  TODAY'S TREATMENT:  DATE: 07/15/23 Moist heat for pain management/muscle relaxation used throughout session, applied to L elbow simultaneous to manual therapy to the RUE, alternating to moist heat on the R elbow simultaneous to manual therapy to the LUE.    Therapeutic Exercise: Green putty issued; instructed in gross grasping/pinching for bilat hand strengthening, making sure to maintain low reps.  Recommended to perform 10 min or less, 1-2x per day to avoid increasing BUE pain from repetition.  Reviewed self passive stretching techniques for flexors and extensors at the forearm, and tricep tendon bilaterally.  Pt reports good adherence to these stretches daily.  Manual Therapy: Instructed pt in scar massage technique for R medial elbow scar; OT performed scar massage, reinforcing its benefits for reducing sensitivity, flattening scar, and decreasing risk developing scar adhesions.  Performed soft tissue massage at the medial and lateral elbows bilaterally, working to reduce pain and increase circulation to tender areas.  Cross friction massage at the volar and dorsal forearms, working to reduce pain, increase circulation, and release tight fascia contributing to muscle stiffness.  Advised pt increase water intake following therapy and ice as needed to reduce soreness following manual therapy.  Pt verbalized understanding.  PATIENT EDUCATION: Education details: HEP, ice/contrast bath/review of joint protection strategies Person educated:  Patient Education method: Explanation Education comprehension: verbalized understanding  HOME EXERCISE PROGRAM: Contrast bath, theraputty, BUE stretching  GOALS: Goals reviewed with patient? Yes  SHORT TERM GOALS: Target date: 08/01/23  Pt will be indep to perform HEP for improving R hand strength and bilat elbow and wrist flex/ext strength for ADL/work tasks. Baseline: Eval: HEP not yet initiated Goal status: INITIAL  2.  Pt will be indep to verbalize 2-3 joint protection/cumulative trauma prevention strategies to reduce pain/paresthesias in BUEs.  Baseline: Eval: initiated positioning education at eval Goal status: INITIAL  LONG TERM GOALS: Target date: 08/21/23  Pt will increase FOTO score to 65 or better to indicate improvement in self perceived functional use of the R arm with daily tasks. Baseline: Eval: 57 Goal status: INITIAL  2.  Pt will increase R grip strength by 10 or more lbs to achieve near age related norms for dominant hand, allowing improved tolerance for lifting/carrying heavy ADL supplies in R dominant hand. Baseline: Eval: R hand 52 lbs (L hand 62 lbs, non-dominant) Goal status: INITIAL  3.  Pt will tolerate manual therapy, therapeutic modalities, and exercises to decrease pain in R elbow to a reported 2/10 pain or less at rest, 4/10 pain or less with activity.  Baseline: Eval: R elbow pain 6-7/10 pain at rest, 8-9/10 pain with activity Goal status: INITIAL  ASSESSMENT: CLINICAL IMPRESSION: Pt with good tolerance to manual therapy this date.  Pt verbalized good understanding of scar massage, soft tissue massage, and cross friction massage techniques.  Pt reports good adherence to routine stretching for BUEs, but has not yet been consistent to ice for pain management.  Pt verbalizes good understanding of joint protection/cumulative trauma prevention strategies, and verbalized plan to reduce frequency of carrying items in hands when able, and rather carrying  phone/wallet in a cross over bag, etc.  Pt will continue to benefit from skilled OT to address pain management, further develop HEP, and continue to review joint protection/cumulative trauma prevention strategies in order to reduce symptoms during work and home tasks.  PERFORMANCE DEFICITS: in functional skills including ADLs, IADLs, sensation, edema, strength, pain, fascial restrictions, body mechanics, decreased knowledge of precautions, decreased knowledge of use of DME, skin integrity, and UE  functional use, and psychosocial skills including coping strategies, environmental adaptation, habits, and routines and behaviors.   IMPAIRMENTS: are limiting patient from ADLs, IADLs, rest and sleep, and work.   COMORBIDITIES: has co-morbidities such as bilat carpal tunnel syndrome, L ulnar nerve decompression  that affects occupational performance. Patient will benefit from skilled OT to address above impairments and improve overall function.  MODIFICATION OR ASSISTANCE TO COMPLETE EVALUATION: No modification of tasks or assist necessary to complete an evaluation.  OT OCCUPATIONAL PROFILE AND HISTORY: Problem focused assessment: Including review of records relating to presenting problem.  CLINICAL DECISION MAKING: Moderate - several treatment options, min-mod task modification necessary  REHAB POTENTIAL: Good  EVALUATION COMPLEXITY: Moderate      PLAN:  OT FREQUENCY: 2x/week  OT DURATION: 6 weeks  PLANNED INTERVENTIONS: 97168 OT Re-evaluation, 97535 self care/ADL training, 02725 therapeutic exercise, 97530 therapeutic activity, 97140 manual therapy, 97035 ultrasound, 97018 paraffin, 36644 moist heat, 97010 cryotherapy, 97034 contrast bath, 97032 electrical stimulation (manual), 97014 electrical stimulation unattended, 97760 Orthotics management and training, 03474 Splinting (initial encounter), [id number] Subsequent splinting/medication, scar mobilization, passive range of motion, coping strategies  training, patient/family education, and DME and/or AE instructions  RECOMMENDED OTHER SERVICES: Dry Needling with PT  CONSULTED AND AGREED WITH PLAN OF CARE: Patient  PLAN FOR NEXT SESSION: Manual therapy/moist heat/contrast bath  Danelle Earthly, MS, OTR/L  Otis Dials, OT 07/17/2023, 8:34 AM

## 2023-07-23 ENCOUNTER — Ambulatory Visit: Payer: Commercial Managed Care - PPO

## 2023-07-28 DIAGNOSIS — J302 Other seasonal allergic rhinitis: Secondary | ICD-10-CM | POA: Diagnosis not present

## 2023-07-28 DIAGNOSIS — E1165 Type 2 diabetes mellitus with hyperglycemia: Secondary | ICD-10-CM | POA: Diagnosis not present

## 2023-07-28 DIAGNOSIS — I10 Essential (primary) hypertension: Secondary | ICD-10-CM | POA: Diagnosis not present

## 2023-07-28 DIAGNOSIS — K219 Gastro-esophageal reflux disease without esophagitis: Secondary | ICD-10-CM | POA: Diagnosis not present

## 2023-07-28 DIAGNOSIS — Z0000 Encounter for general adult medical examination without abnormal findings: Secondary | ICD-10-CM | POA: Diagnosis not present

## 2023-07-28 DIAGNOSIS — R7309 Other abnormal glucose: Secondary | ICD-10-CM | POA: Diagnosis not present

## 2023-07-30 ENCOUNTER — Other Ambulatory Visit: Payer: Self-pay

## 2023-07-30 DIAGNOSIS — M79601 Pain in right arm: Secondary | ICD-10-CM | POA: Diagnosis not present

## 2023-07-30 DIAGNOSIS — E7800 Pure hypercholesterolemia, unspecified: Secondary | ICD-10-CM | POA: Diagnosis not present

## 2023-07-30 DIAGNOSIS — M79602 Pain in left arm: Secondary | ICD-10-CM | POA: Diagnosis not present

## 2023-07-30 DIAGNOSIS — Z6833 Body mass index (BMI) 33.0-33.9, adult: Secondary | ICD-10-CM | POA: Diagnosis not present

## 2023-07-30 DIAGNOSIS — I10 Essential (primary) hypertension: Secondary | ICD-10-CM | POA: Diagnosis not present

## 2023-07-30 MED ORDER — ATORVASTATIN CALCIUM 20 MG PO TABS
20.0000 mg | ORAL_TABLET | ORAL | 3 refills | Status: AC
  Filled 2023-07-30: qty 12, 28d supply, fill #0

## 2023-07-30 MED ORDER — SERTRALINE HCL 100 MG PO TABS
100.0000 mg | ORAL_TABLET | Freq: Every day | ORAL | 1 refills | Status: AC
  Filled 2023-11-08: qty 90, 90d supply, fill #0

## 2023-07-30 MED ORDER — PANTOPRAZOLE SODIUM 40 MG PO TBEC
40.0000 mg | DELAYED_RELEASE_TABLET | Freq: Every day | ORAL | 1 refills | Status: DC
  Filled 2023-07-30: qty 90, 90d supply, fill #0
  Filled 2023-08-23 – 2023-11-08 (×2): qty 90, 90d supply, fill #1

## 2023-07-30 MED ORDER — MOUNJARO 7.5 MG/0.5ML ~~LOC~~ SOAJ
7.5000 mg | SUBCUTANEOUS | 5 refills | Status: DC
Start: 2023-07-30 — End: 2024-02-04
  Filled 2023-07-30: qty 2, 28d supply, fill #0
  Filled 2023-11-08: qty 2, 28d supply, fill #1
  Filled 2023-12-15: qty 2, 28d supply, fill #2
  Filled 2024-01-12: qty 2, 28d supply, fill #3

## 2023-07-30 MED ORDER — ALPRAZOLAM 0.25 MG PO TABS
0.2500 mg | ORAL_TABLET | Freq: Every day | ORAL | 3 refills | Status: AC | PRN
  Filled 2023-07-30: qty 30, 30d supply, fill #0

## 2023-07-30 MED ORDER — BUSPIRONE HCL 15 MG PO TABS
15.0000 mg | ORAL_TABLET | Freq: Two times a day (BID) | ORAL | 1 refills | Status: AC
  Filled 2023-07-30: qty 180, 90d supply, fill #0

## 2023-07-31 ENCOUNTER — Ambulatory Visit: Payer: Commercial Managed Care - PPO | Attending: Neurosurgery

## 2023-07-31 DIAGNOSIS — G5621 Lesion of ulnar nerve, right upper limb: Secondary | ICD-10-CM | POA: Insufficient documentation

## 2023-07-31 DIAGNOSIS — M25521 Pain in right elbow: Secondary | ICD-10-CM | POA: Diagnosis not present

## 2023-07-31 DIAGNOSIS — Z9889 Other specified postprocedural states: Secondary | ICD-10-CM | POA: Diagnosis not present

## 2023-07-31 DIAGNOSIS — M6281 Muscle weakness (generalized): Secondary | ICD-10-CM | POA: Diagnosis not present

## 2023-07-31 DIAGNOSIS — M79601 Pain in right arm: Secondary | ICD-10-CM | POA: Insufficient documentation

## 2023-07-31 NOTE — Therapy (Signed)
OUTPATIENT OCCUPATIONAL THERAPY ORTHO TREATMENT NOTE  Patient Name: Donna Fowler MRN: [id number] DOB:1975/12/19, 47 y.o., female Today's Date: 07/31/2023  PCP: Dr. Barbette Reichmann REFERRING PROVIDER: Dr. Ernestine Mcmurray (Neuro surgeon)  END OF SESSION:  OT End of Session - 07/31/23 1228     Visit Number 4    Number of Visits 12    Date for OT Re-Evaluation 08/21/23    Progress Note Due on Visit 10    OT Start Time 1015    OT Stop Time 1110    OT Time Calculation (min) 55 min    Activity Tolerance Patient tolerated treatment well    Behavior During Therapy WFL for tasks assessed/performed               Past Medical History:  Diagnosis Date   Anemia    Anxiety    Arthritis    Asthma    well controlled   Carpal tunnel syndrome on both sides    Complication of anesthesia    delayed emergence   Cubital tunnel syndrome on right    Depression    Elevated hemoglobin A1c    last a1c was on 01-22-23 (5.9)   Endocervical polyp    GERD (gastroesophageal reflux disease)    Gestational diabetes    Gestational hypertension    Headache    Hypercholesterolemia    Infertility, female    Migraine    Newborn product of in vitro fertilization (IVF) pregnancy    Past Surgical History:  Procedure Laterality Date   ANTERIOR INTEROSSEOUS NERVE DECOMPRESSION Right 04/23/2023   Procedure: RIGHT CUBITAL TUNNEL DECOMPRESSION;  Surgeon: Loreen Freud, MD;  Location: [facility] ORS;  Service: Neurosurgery;  Laterality: Right;  LOCAL W/ MAC   CESAREAN SECTION N/A 04/26/2020   Procedure: CESAREAN SECTION;  Surgeon: Ranae Pila, MD;  Location: [facility] LD ORS;  Service: Obstetrics;  Laterality: N/A;   COLONOSCOPY WITH ESOPHAGOGASTRODUODENOSCOPY (EGD)  2017   COLONOSCOPY WITH PROPOFOL N/A 03/06/2022   Procedure: COLONOSCOPY WITH PROPOFOL;  Surgeon: Wyline Mood, MD;  Location: [facility] ENDOSCOPY;  Service: Gastroenterology;  Laterality: N/A;   DILATATION & CURETTAGE/HYSTEROSCOPY WITH MYOSURE  N/A 06/18/2018   Procedure: DILATATION & CURETTAGE/HYSTEROSCOPY WITH MYOSURE;  Surgeon: Mitchel Honour, DO;  Location: [facility];  Service: Gynecology;  Laterality: N/A;   ELBOW ARTHROSCOPY Left 2015   ESOPHAGOGASTRODUODENOSCOPY N/A 03/06/2022   Procedure: ESOPHAGOGASTRODUODENOSCOPY (EGD);  Surgeon: Wyline Mood, MD;  Location: [facility] ENDOSCOPY;  Service: Gastroenterology;  Laterality: N/A;   excision of lymph node     axillary    LAPAROSCOPIC CHOLECYSTECTOMY  2009   PILONIDAL CYST EXCISION     Patient Active Problem List   Diagnosis Date Noted   Cubital tunnel syndrome on right 03/30/2023   Indication for care in labor or delivery 04/26/2020   ONSET DATE: 04/23/2023 (s/p decompression of ulnar nerve at R elbow)  REFERRING DIAG:  Diagnosis  G56.21 (ICD-10-CM) - Cubital tunnel syndrome on right  Z98.890 (ICD-10-CM) - S/P decompression of ulnar nerve at elbow   THERAPY DIAG:  Muscle weakness (generalized)  Pain in right elbow  Cubital tunnel syndrome on right  S/P decompression of ulnar nerve at elbow  Pain in right arm  Rationale for Evaluation and Treatment: Rehabilitation  SUBJECTIVE:  SUBJECTIVE STATEMENT: Pt reports having to cancel last week d/t work and family schedule. Pt accompanied by: self  PERTINENT HISTORY: Pt reports L ulnar nerve decompression sx in 2015, and now the R on 04/23/2023.  Pt reports  pain is still present in both elbows, though the R is more severe.  Pt reports dry needling was tried in the past with little relief, though this was attempted prior to both surgeries, but not after.  Pt verbalizes that she would like to try dry needling post surgery (PT referral requested).  Pt also reports hx of bilat carpal tunnel, which can still be aggravating.  PRECAUTIONS: None  RED FLAGS: None   WEIGHT BEARING RESTRICTIONS: No  PAIN: 07/31/23: 7 beginning of session, reduced to 5 end of session in BUEs Are you having pain? Yes: NPRS scale: 6-7 at  rest, 8-9/10 Pain location: bilat elbows (L lateral elbow, R lateral and medial elbow, bilat triceps tendon and forearm extensors all tender to palpate Pain description: stabbing at times, throbbing, tender  Aggravating factors: using the arms, lifting, pulling, palpation to sore areas noted above Relieving factors: ice, heat, pain meds, rest  LIVING ENVIRONMENT: Lives with: lives with their family, spouse and 81 y/o son Has following equipment at home:  R elbow brace, bilat carpal tunnel braces, bilat compression sleeves for the elbows , TENS unit (but has not recently used)  PLOF: Independent with ADL/IADLs, 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    PATIENT GOALS: Reduce/eliminate pain in bilat elbows  NEXT MD VISIT: "As needed"  OBJECTIVE:  Note: Objective measures were completed at Evaluation unless otherwise noted.  HAND DOMINANCE: Right  ADLs: Overall ADLs: Frequent dropping of ADL supplies Transfers/ambulation related to ADLs: indep Eating: able to cut food without difficulty Grooming: pain/fatigue with holding a hair dryer UB Dressing: indep LB Dressing: indep Toileting: indep Bathing: pain in elbows when washing/drying hair  FUNCTIONAL OUTCOME MEASURES: FOTO: 57; predicted 65  UPPER EXTREMITY ROM:  WNL BUEs  UPPER EXTREMITY MMT:     MMT Right eval Left eval  Shoulder flexion 5 5  Shoulder abduction 5 5  Shoulder adduction    Shoulder extension    Shoulder internal rotation    Shoulder external rotation    Middle trapezius    Lower trapezius    Elbow flexion 4+ 4+  Elbow extension 4+ 4+  Wrist flexion 4+ 4+  Wrist extension 4+ 4+  Wrist ulnar deviation    Wrist radial deviation    Wrist pronation    Wrist supination    (Blank rows = not tested)  HAND FUNCTION: Grip strength: Right: 52 lbs; Left: 62 lbs, Lateral pinch: Right: 16 lbs, Left: 21 lbs, and 3 point pinch: Right: 17 lbs, Left: 19 lbs  COORDINATION:  WNL  SENSATION: Intermittent tingling/numbness R hand ulnar nerve distribution  EDEMA: No visible edema  COGNITION: Overall cognitive status: Within functional limits for tasks assessed  OBSERVATIONS:  Pt pleasant, cooperative, eager to begin tx to reduce pain, receptive to tx plan and education provided during session  R medial elbow surgical scar: slightly raised, pink, well approximated  TODAY'S TREATMENT:  DATE: 07/31/23 Moist heat for pain management/muscle relaxation used throughout session, applied to L elbow simultaneous to manual therapy to the RUE, alternating to moist heat on the R elbow simultaneous to manual therapy to the LUE.    Manual Therapy: Performed scar massage to R medial elbow, working to reduce sensitivity, flatten, and mobilize tissue beneath to decrease risk of developing scar adhesions.  Performed soft tissue massage at the medial and lateral elbows bilaterally, working to reduce pain and increase circulation to tender areas.  Utilized Graston tool for BUEs for fascial release on the volar and dorsal forearms, medial and lateral elbows, and triceps.  Good tolerance throughout.  Self Care: Review of cumulative trauma prevention strategies, including activity modification and body mechanics, to reduce strain on elbows and wrists while working and at home.  Pt reports she's been delegating strenuous tasks to spouse as much as possible, carrying purse with strap across shoulder, carrying light grocery bags/1 by 1 or with a shoulder bag, stretching more frequently at work and home, and carrying/holding items in 2 hands rather than 1 when needed or able.  Therapeutic Activity: Pt verbalized that she has recently found K-tape to be effective in reducing pain in BUEs. Kinesiotape applied to BUEs as follows: Anchored distally at the mid dorsal  forearms, extending up at lateral elbows and forking at the triceps, ~50-75% stretch.   Applied additional taping, anchoring at upper traps, using ~50-75% stretch down to mid traps R/L, promoting muscle relaxation for muscle tension in upper traps bilaterally.  OT reviewed application strategies with pt; verbalized understanding.    PATIENT EDUCATION: Education details: Dietitian Person educated: Patient Education method: Explanation Education comprehension: verbalized understanding  HOME EXERCISE PROGRAM: Contrast bath, theraputty, BUE stretching  GOALS: Goals reviewed with patient? Yes  SHORT TERM GOALS: Target date: 08/01/23  Pt will be indep to perform HEP for improving R hand strength and bilat elbow and wrist flex/ext strength for ADL/work tasks. Baseline: Eval: HEP not yet initiated Goal status: INITIAL  2.  Pt will be indep to verbalize 2-3 joint protection/cumulative trauma prevention strategies to reduce pain/paresthesias in BUEs.  Baseline: Eval: initiated positioning education at eval Goal status: INITIAL  LONG TERM GOALS: Target date: 08/21/23  Pt will increase FOTO score to 65 or better to indicate improvement in self perceived functional use of the R arm with daily tasks. Baseline: Eval: 57 Goal status: INITIAL  2.  Pt will increase R grip strength by 10 or more lbs to achieve near age related norms for dominant hand, allowing improved tolerance for lifting/carrying heavy ADL supplies in R dominant hand. Baseline: Eval: R hand 52 lbs (L hand 62 lbs, non-dominant) Goal status: INITIAL  3.  Pt will tolerate manual therapy, therapeutic modalities, and exercises to decrease pain in R elbow to a reported 2/10 pain or less at rest, 4/10 pain or less with activity.  Baseline: Eval: R elbow pain 6-7/10 pain at rest, 8-9/10 pain with activity Goal status: INITIAL  ASSESSMENT: CLINICAL IMPRESSION: Pt reports that she's been stretching more and working on activity  modifications for work and home tasks to reduce strain in [place].  Pt also reports Ktape has been effective and requested to have OT apply tape today.  Pt verbalized good reduction in pain from 7/10 at start of session to 5/10 at end of session following manual therapy, heat, and application of taping noted above.  Plan to hold OT for the next couple of weeks to allow pt to  focus on PT for dry needling and additional manual therapy.  Will assess need for additional OT after the next couple of weeks.  Pt verbalized understanding and in agreement with plan.    PERFORMANCE DEFICITS: in functional skills including ADLs, IADLs, sensation, edema, strength, pain, fascial restrictions, body mechanics, decreased knowledge of precautions, decreased knowledge of use of DME, skin integrity, and UE functional use, and psychosocial skills including coping strategies, environmental adaptation, habits, and routines and behaviors.   IMPAIRMENTS: are limiting patient from ADLs, IADLs, rest and sleep, and work.   COMORBIDITIES: has co-morbidities such as bilat carpal tunnel syndrome, L ulnar nerve decompression  that affects occupational performance. Patient will benefit from skilled OT to address above impairments and improve overall function.  MODIFICATION OR ASSISTANCE TO COMPLETE EVALUATION: No modification of tasks or assist necessary to complete an evaluation.  OT OCCUPATIONAL PROFILE AND HISTORY: Problem focused assessment: Including review of records relating to presenting problem.  CLINICAL DECISION MAKING: Moderate - several treatment options, min-mod task modification necessary  REHAB POTENTIAL: Good  EVALUATION COMPLEXITY: Moderate      PLAN:  OT FREQUENCY: 2x/week  OT DURATION: 6 weeks  PLANNED INTERVENTIONS: 97168 OT Re-evaluation, 97535 self care/ADL training, 65784 therapeutic exercise, 97530 therapeutic activity, 97140 manual therapy, 97035 ultrasound, 97018 paraffin, 69629 moist heat, 97010  cryotherapy, 97034 contrast bath, 97032 electrical stimulation (manual), 97014 electrical stimulation unattended, 97760 Orthotics management and training, 52841 Splinting (initial encounter), [id number] Subsequent splinting/medication, scar mobilization, passive range of motion, coping strategies training, patient/family education, and DME and/or AE instructions  RECOMMENDED OTHER SERVICES: Dry Needling with PT  CONSULTED AND AGREED WITH PLAN OF CARE: Patient  PLAN FOR NEXT SESSION: Manual therapy/moist heat/contrast bath  Danelle Earthly, MS, OTR/L  Otis Dials, OT 07/31/2023, 12:30 PM

## 2023-08-07 ENCOUNTER — Ambulatory Visit: Payer: Commercial Managed Care - PPO

## 2023-08-13 ENCOUNTER — Ambulatory Visit: Payer: Commercial Managed Care - PPO

## 2023-08-14 ENCOUNTER — Ambulatory Visit: Payer: Commercial Managed Care - PPO

## 2023-08-23 ENCOUNTER — Other Ambulatory Visit: Payer: Self-pay

## 2023-08-24 ENCOUNTER — Other Ambulatory Visit: Payer: Self-pay

## 2023-08-24 MED ORDER — FLUTICASONE PROPIONATE 50 MCG/ACT NA SUSP
2.0000 | Freq: Every day | NASAL | 11 refills | Status: AC
  Filled 2023-08-24: qty 16, 30d supply, fill #0
  Filled 2023-11-08: qty 16, 30d supply, fill #1
  Filled 2023-12-15: qty 16, 30d supply, fill #2

## 2023-08-27 ENCOUNTER — Ambulatory Visit: Payer: Commercial Managed Care - PPO

## 2023-11-09 ENCOUNTER — Other Ambulatory Visit: Payer: Self-pay

## 2023-11-12 ENCOUNTER — Other Ambulatory Visit: Payer: Self-pay

## 2023-12-10 ENCOUNTER — Other Ambulatory Visit: Payer: Self-pay

## 2023-12-10 DIAGNOSIS — N939 Abnormal uterine and vaginal bleeding, unspecified: Secondary | ICD-10-CM | POA: Diagnosis not present

## 2023-12-10 DIAGNOSIS — Z01419 Encounter for gynecological examination (general) (routine) without abnormal findings: Secondary | ICD-10-CM | POA: Diagnosis not present

## 2023-12-10 MED ORDER — FERROUS SULFATE 325 (65 FE) MG PO TABS
325.0000 mg | ORAL_TABLET | Freq: Every day | ORAL | 2 refills | Status: AC
  Filled 2023-12-10: qty 30, 30d supply, fill #0
  Filled 2024-01-12: qty 30, 30d supply, fill #1

## 2024-01-12 ENCOUNTER — Other Ambulatory Visit: Payer: Self-pay

## 2024-01-26 DIAGNOSIS — E7800 Pure hypercholesterolemia, unspecified: Secondary | ICD-10-CM | POA: Diagnosis not present

## 2024-01-26 DIAGNOSIS — I10 Essential (primary) hypertension: Secondary | ICD-10-CM | POA: Diagnosis not present

## 2024-01-26 DIAGNOSIS — M79602 Pain in left arm: Secondary | ICD-10-CM | POA: Diagnosis not present

## 2024-01-26 DIAGNOSIS — Z6833 Body mass index (BMI) 33.0-33.9, adult: Secondary | ICD-10-CM | POA: Diagnosis not present

## 2024-01-26 DIAGNOSIS — M79601 Pain in right arm: Secondary | ICD-10-CM | POA: Diagnosis not present

## 2024-02-04 ENCOUNTER — Other Ambulatory Visit: Payer: Self-pay

## 2024-02-04 DIAGNOSIS — Z0000 Encounter for general adult medical examination without abnormal findings: Secondary | ICD-10-CM | POA: Diagnosis not present

## 2024-02-04 DIAGNOSIS — I10 Essential (primary) hypertension: Secondary | ICD-10-CM | POA: Diagnosis not present

## 2024-02-04 DIAGNOSIS — R519 Headache, unspecified: Secondary | ICD-10-CM | POA: Diagnosis not present

## 2024-02-04 DIAGNOSIS — J4520 Mild intermittent asthma, uncomplicated: Secondary | ICD-10-CM | POA: Diagnosis not present

## 2024-02-04 DIAGNOSIS — E7800 Pure hypercholesterolemia, unspecified: Secondary | ICD-10-CM | POA: Diagnosis not present

## 2024-02-04 DIAGNOSIS — Z6832 Body mass index (BMI) 32.0-32.9, adult: Secondary | ICD-10-CM | POA: Diagnosis not present

## 2024-02-04 DIAGNOSIS — Z1331 Encounter for screening for depression: Secondary | ICD-10-CM | POA: Diagnosis not present

## 2024-02-04 MED ORDER — ERGOCALCIFEROL 1.25 MG (50000 UT) PO CAPS
50000.0000 [IU] | ORAL_CAPSULE | ORAL | 1 refills | Status: AC
  Filled 2024-02-04: qty 12, 84d supply, fill #0
  Filled 2024-05-05: qty 12, 84d supply, fill #1
  Filled 2024-10-02: qty 2, 14d supply, fill #2

## 2024-02-04 MED ORDER — ELETRIPTAN HYDROBROMIDE 20 MG PO TABS
20.0000 mg | ORAL_TABLET | ORAL | 3 refills | Status: DC
  Filled 2024-02-04 – 2024-02-10 (×2): qty 6, 30d supply, fill #0

## 2024-02-04 MED ORDER — PANTOPRAZOLE SODIUM 40 MG PO TBEC
40.0000 mg | DELAYED_RELEASE_TABLET | Freq: Every day | ORAL | 1 refills | Status: DC
  Filled 2024-02-04: qty 90, 90d supply, fill #0
  Filled 2024-05-05: qty 90, 90d supply, fill #1

## 2024-02-04 MED ORDER — SERTRALINE HCL 100 MG PO TABS
100.0000 mg | ORAL_TABLET | Freq: Every day | ORAL | 1 refills | Status: AC
  Filled 2024-02-04: qty 90, 90d supply, fill #0

## 2024-02-04 MED ORDER — ALPRAZOLAM 0.25 MG PO TABS
0.2500 mg | ORAL_TABLET | Freq: Every day | ORAL | 3 refills | Status: AC | PRN
  Filled 2024-02-04: qty 30, 30d supply, fill #0

## 2024-02-04 MED ORDER — MOUNJARO 10 MG/0.5ML ~~LOC~~ SOAJ
10.0000 mg | SUBCUTANEOUS | 3 refills | Status: DC
  Filled 2024-02-04: qty 2, 28d supply, fill #0
  Filled 2024-03-04: qty 2, 28d supply, fill #1
  Filled 2024-04-27 – 2024-05-09 (×2): qty 2, 28d supply, fill #2

## 2024-02-04 MED ORDER — BUSPIRONE HCL 15 MG PO TABS
15.0000 mg | ORAL_TABLET | Freq: Two times a day (BID) | ORAL | 1 refills | Status: AC
  Filled 2024-02-04: qty 180, 90d supply, fill #0

## 2024-02-04 MED ORDER — FLUTICASONE PROPIONATE 50 MCG/ACT NA SUSP
2.0000 | Freq: Every day | NASAL | 11 refills | Status: AC
  Filled 2024-02-04: qty 16, 30d supply, fill #0
  Filled 2024-09-01: qty 16, 30d supply, fill #1
  Filled 2024-10-02: qty 16, 30d supply, fill #2

## 2024-02-04 MED ORDER — ATORVASTATIN CALCIUM 20 MG PO TABS
20.0000 mg | ORAL_TABLET | ORAL | 1 refills | Status: AC
  Filled 2024-02-04: qty 36, 84d supply, fill #0

## 2024-02-08 ENCOUNTER — Other Ambulatory Visit: Payer: Self-pay

## 2024-02-10 ENCOUNTER — Other Ambulatory Visit: Payer: Self-pay

## 2024-02-11 ENCOUNTER — Other Ambulatory Visit: Payer: Self-pay

## 2024-02-11 MED ORDER — SUMATRIPTAN SUCCINATE 100 MG PO TABS
100.0000 mg | ORAL_TABLET | ORAL | 2 refills | Status: AC
  Filled 2024-02-11 – 2024-03-04 (×2): qty 9, 30d supply, fill #0

## 2024-02-22 ENCOUNTER — Other Ambulatory Visit: Payer: Self-pay

## 2024-02-23 ENCOUNTER — Other Ambulatory Visit: Payer: Self-pay

## 2024-03-04 ENCOUNTER — Other Ambulatory Visit: Payer: Self-pay

## 2024-04-18 ENCOUNTER — Ambulatory Visit: Admitting: Neurosurgery

## 2024-04-18 ENCOUNTER — Encounter: Payer: Self-pay | Admitting: Neurosurgery

## 2024-04-18 VITALS — BP 134/88 | Ht 63.0 in | Wt 182.0 lb

## 2024-04-18 DIAGNOSIS — M79602 Pain in left arm: Secondary | ICD-10-CM

## 2024-04-18 DIAGNOSIS — M5412 Radiculopathy, cervical region: Secondary | ICD-10-CM

## 2024-04-18 DIAGNOSIS — Z9889 Other specified postprocedural states: Secondary | ICD-10-CM

## 2024-04-18 DIAGNOSIS — R531 Weakness: Secondary | ICD-10-CM | POA: Diagnosis not present

## 2024-04-18 DIAGNOSIS — G5621 Lesion of ulnar nerve, right upper limb: Secondary | ICD-10-CM

## 2024-04-18 DIAGNOSIS — M79601 Pain in right arm: Secondary | ICD-10-CM | POA: Diagnosis not present

## 2024-04-18 DIAGNOSIS — M542 Cervicalgia: Secondary | ICD-10-CM

## 2024-04-18 NOTE — Progress Notes (Signed)
 Referring Physician:  Sadie Manna, MD [address] [facility] [place],  [place] [address]  Primary Physician:  Sadie Manna, MD  History of Present Illness: 04/18/2024 [name] is here today with a chief complaint of neck and arm pain.  She has pain in her neck prickly with rotation.  It radiates down her arms to her fingers.  It impacts all of her fingers at times of her 4th and 5th are worse.  She has been stretching for 5 months without improvement.  Daily activities make her pain worse.  Nothing really helps.  She feels some weakness in her grip on both sides.  She underwent a right sided cubital tunnel release about a year ago with only minor improvement.   Bowel/Bladder Dysfunction: none  Conservative measures:  Physical therapy: has not participated for her neck Multimodal medical therapy including regular antiinflammatories:  Tylenol , Ibuprofen , gabapentin  Injections:  no epidural steroid injections  Past Surgery: 04/23/2023 right cubital tunnel decompression   Donna Fowler has no symptoms of cervical myelopathy.  The symptoms are causing a significant impact on the patient's life.   I have utilized the care everywhere function in epic to review the outside records available from external health systems.  Review of Systems:  A 10 point review of systems is negative, except for the pertinent positives and negatives detailed in the HPI.  Past Medical History: Past Medical History:  Diagnosis Date   Anemia    Anxiety    Arthritis    Asthma    well controlled   Carpal tunnel syndrome on both sides    Complication of anesthesia    delayed emergence   Cubital tunnel syndrome on right    Depression    Elevated hemoglobin A1c    last a1c was on 01-22-23 (5.9)   Endocervical polyp    GERD (gastroesophageal reflux disease)    Gestational diabetes    Gestational hypertension    Headache    Hypercholesterolemia    Infertility,  female    Migraine    Newborn product of in vitro fertilization (IVF) pregnancy     Past Surgical History: Past Surgical History:  Procedure Laterality Date   ANTERIOR INTEROSSEOUS NERVE DECOMPRESSION Right 04/23/2023   Procedure: RIGHT CUBITAL TUNNEL DECOMPRESSION;  Surgeon: Claudene Penne Lin, MD;  Location: [facility] ORS;  Service: Neurosurgery;  Laterality: Right;  LOCAL W/ MAC   CESAREAN SECTION N/A 04/26/2020   Procedure: CESAREAN SECTION;  Surgeon: Marne Kelly Nest, MD;  Location: [facility] LD ORS;  Service: Obstetrics;  Laterality: N/A;   COLONOSCOPY WITH ESOPHAGOGASTRODUODENOSCOPY (EGD)  2017   COLONOSCOPY WITH PROPOFOL  N/A 03/06/2022   Procedure: COLONOSCOPY WITH PROPOFOL ;  Surgeon: Therisa Bi, MD;  Location: [facility] ENDOSCOPY;  Service: Gastroenterology;  Laterality: N/A;   DILATATION & CURETTAGE/HYSTEROSCOPY WITH MYOSURE N/A 06/18/2018   Procedure: DILATATION & CURETTAGE/HYSTEROSCOPY WITH MYOSURE;  Surgeon: Dannielle Bouchard, DO;  Location: [facility];  Service: Gynecology;  Laterality: N/A;   ELBOW ARTHROSCOPY Left 2015   ESOPHAGOGASTRODUODENOSCOPY N/A 03/06/2022   Procedure: ESOPHAGOGASTRODUODENOSCOPY (EGD);  Surgeon: Therisa Bi, MD;  Location: [facility] ENDOSCOPY;  Service: Gastroenterology;  Laterality: N/A;   excision of lymph node     axillary    LAPAROSCOPIC CHOLECYSTECTOMY  2009   PILONIDAL CYST EXCISION      Allergies: Allergies as of 04/18/2024   (No Known Allergies)    Medications:  Current Outpatient Medications:    acetaminophen  (TYLENOL ) 650 MG CR tablet, Take 1,300  mg by mouth every 8 (eight) hours as needed for pain. , Disp: , Rfl:    albuterol  (VENTOLIN  HFA) 108 (90 Base) MCG/ACT inhaler, Inhale 2 inhalations into the lungs every 6 (six) hours as needed for up to 90 days (Patient taking differently: Inhale 2 puffs into the lungs every 6 (six) hours as needed.), Disp: 6.7 g, Rfl: 2   ALPRAZolam  (XANAX ) 0.25 MG tablet, Take 1 tablet (0.25 mg total) by  mouth daily as needed for sleep., Disp: 30 tablet, Rfl: 3   aspirin EC 81 MG tablet, Take 81 mg by mouth daily. Swallow whole., Disp: , Rfl:    atorvastatin  (LIPITOR) 20 MG tablet, Take 1 tablet by mouth 3 days a week., Disp: 30 tablet, Rfl: 3   busPIRone  (BUSPAR ) 15 MG tablet, Take 1 tablet (15 mg total) by mouth 2 (two) times daily. (Patient taking differently: Take 15 mg by mouth at bedtime.), Disp: 180 tablet, Rfl: 1   busPIRone  (BUSPAR ) 15 MG tablet, Take 1 tablet (15 mg total) by mouth 2 (two) times daily., Disp: 180 tablet, Rfl: 1   cetirizine (ZYRTEC) 10 MG tablet, Take 10 mg by mouth at bedtime. , Disp: , Rfl:    ergocalciferol  (VITAMIN D2) 1.25 MG (50000 UT) capsule, Take 1 capsule (50,000 Units total) by mouth once a week., Disp: 13 capsule, Rfl: 1   ferrous sulfate  325 (65 FE) MG tablet, Take 1 tablet (325 mg total) by mouth daily., Disp: 30 tablet, Rfl: 2   fluticasone  (FLONASE ) 50 MCG/ACT nasal spray, Place 2 sprays into both nostrils daily., Disp: 16 g, Rfl: 11   fluticasone  (FLONASE ) 50 MCG/ACT nasal spray, Place 2 sprays into both nostrils daily., Disp: 16 g, Rfl: 11   ibuprofen  (ADVIL ) 600 MG tablet, Take 1 tablet (600 mg total) by mouth every 6 (six) hours as needed., Disp: 60 tablet, Rfl: 0   pantoprazole  (PROTONIX ) 40 MG tablet, Take 1 tablet (40 mg total) by mouth daily., Disp: 90 tablet, Rfl: 1   sertraline  (ZOLOFT ) 100 MG tablet, Take 1 tablet (100 mg total) by mouth daily. (Patient taking differently: Take 100 mg by mouth at bedtime.), Disp: 90 tablet, Rfl: 1   SUMAtriptan  (IMITREX ) 100 MG tablet, Take 1 tablet (100 mg total) by mouth as directed for Migraine. May take a second dose after 2 hours if needed., Disp: 9 tablet, Rfl: 2   tirzepatide  (MOUNJARO ) 10 MG/0.5ML Pen, Inject 10 mg into the skin once a week., Disp: 1.5 mL, Rfl: 3   Vitamin D , Ergocalciferol , (DRISDOL ) 1.25 MG (50000 UNIT) CAPS capsule, Take 1 capsule (50,000 Units total) by mouth once a week., Disp: 4  capsule, Rfl: 5   ALPRAZolam  (XANAX ) 0.25 MG tablet, Take 1 tablet (0.25 mg total) by mouth once daily as needed for sleep., Disp: 30 tablet, Rfl: 3   ALPRAZolam  (XANAX ) 0.25 MG tablet, Take 1 tablet (0.25 mg total) by mouth daily as needed for sleep., Disp: 30 tablet, Rfl: 3   atorvastatin  (LIPITOR) 20 MG tablet, Take 1 tablet (20 mg total) by mouth 3 (three) times a week., Disp: 90 tablet, Rfl: 1   busPIRone  (BUSPAR ) 15 MG tablet, Take 1 tablet (15 mg total) by mouth 2 (two) times daily., Disp: 180 tablet, Rfl: 1   fluticasone  (FLONASE ) 50 MCG/ACT nasal spray, Place 2 sprays into both nostrils once daily (Patient taking differently: Place 1 spray into both nostrils as needed.), Disp: 16 g, Rfl: 11   sertraline  (ZOLOFT ) 100 MG tablet, Take 1 tablet (100  mg total) by mouth daily., Disp: 90 tablet, Rfl: 1   sertraline  (ZOLOFT ) 100 MG tablet, Take 1 tablet (100 mg total) by mouth daily., Disp: 90 tablet, Rfl: 1  Social History: Social History   Tobacco Use   Smoking status: Never   Smokeless tobacco: Never  Vaping Use   Vaping status: Never Used  Substance Use Topics   Alcohol  use: Not Currently   Drug use: Never    Family Medical History: Family History  Problem Relation Age of Onset   Heart disease Mother        CABG   Diabetes Mother    Hypercholesterolemia Father    Hypertension Father    Heart disease Maternal Grandfather        CABG   Breast cancer Neg Hx     Physical Examination: Vitals:   04/18/24 1331  BP: 134/88    General: Patient is in no apparent distress. Attention to examination is appropriate.  Neck:   Supple.  Full range of motion.  Respiratory: Patient is breathing without any difficulty.   NEUROLOGICAL:     Awake, alert, oriented to person, place, and time.  Speech is clear and fluent.   Cranial Nerves: Pupils equal round and reactive to light.  Facial tone is symmetric.  Facial sensation is symmetric. Shoulder shrug is symmetric. Tongue protrusion  is midline.  There is no pronator drift.  Strength: Side Biceps Triceps Deltoid Interossei Grip Wrist Ext. Wrist Flex.  R 5 5 5  4+ 4 5 5   L 5 5 5  4+ 4 5 5    Side Iliopsoas Quads Hamstring PF DF EHL  R 5 5 5 5 5 5   L 5 5 5 5 5 5    Reflexes are 1+ and symmetric at the biceps, triceps, brachioradialis, patella and achilles.   Hoffman's is absent.   Bilateral upper and lower extremity sensation is intact to light touch.    No evidence of dysmetria noted.  Gait is normal.     Medical Decision Making  Imaging: None to review  I have personally reviewed the images and agree with the above interpretation.  Assessment and Plan: Donna Fowler is a pleasant 48 y.o. female with possible cervical radiculopathy.  She has some symptoms of chronic ulnar neuropathy, but she continues to have significant pain into her shoulder blades and down her arms.  She has been having pain for more than 6 months, though it has been worsening for the past 5 months.  At this point, I feel that imaging is indicated given her objective weakness on examination.  If no compressive lesion is found, we discussed restarting gabapentin  and dose escalating until she gets some relief.   I spent a total of 20 minutes in this patient's care today. This time was spent reviewing pertinent records including imaging studies, obtaining and confirming history, performing a directed evaluation, formulating and discussing my recommendations, and documenting the visit within the medical record.     Thank you for involving me in the care of this patient.      Miriam Liles K. Clois MD, [facility]

## 2024-04-20 ENCOUNTER — Ambulatory Visit
Admission: RE | Admit: 2024-04-20 | Discharge: 2024-04-20 | Disposition: A | Discharge status: Home / Self Care | Source: Ambulatory Visit | Attending: Neurosurgery | Admitting: Neurosurgery

## 2024-04-20 DIAGNOSIS — M5412 Radiculopathy, cervical region: Secondary | ICD-10-CM | POA: Insufficient documentation

## 2024-04-20 DIAGNOSIS — M4722 Other spondylosis with radiculopathy, cervical region: Secondary | ICD-10-CM | POA: Diagnosis not present

## 2024-04-20 DIAGNOSIS — M542 Cervicalgia: Secondary | ICD-10-CM | POA: Diagnosis not present

## 2024-04-26 ENCOUNTER — Encounter: Payer: Self-pay | Admitting: Internal Medicine

## 2024-04-26 DIAGNOSIS — Z1231 Encounter for screening mammogram for malignant neoplasm of breast: Secondary | ICD-10-CM

## 2024-04-27 ENCOUNTER — Other Ambulatory Visit: Payer: Self-pay | Admitting: Neurosurgery

## 2024-04-27 DIAGNOSIS — G5621 Lesion of ulnar nerve, right upper limb: Secondary | ICD-10-CM

## 2024-04-27 DIAGNOSIS — M542 Cervicalgia: Secondary | ICD-10-CM

## 2024-04-27 DIAGNOSIS — Z9889 Other specified postprocedural states: Secondary | ICD-10-CM

## 2024-04-29 ENCOUNTER — Other Ambulatory Visit: Payer: Self-pay | Admitting: Internal Medicine

## 2024-04-29 DIAGNOSIS — Z1231 Encounter for screening mammogram for malignant neoplasm of breast: Secondary | ICD-10-CM

## 2024-05-09 ENCOUNTER — Other Ambulatory Visit: Payer: Self-pay

## 2024-05-20 ENCOUNTER — Ambulatory Visit
Admission: RE | Admit: 2024-05-20 | Discharge: 2024-05-20 | Disposition: A | Discharge status: Home / Self Care | Source: Ambulatory Visit | Attending: Internal Medicine | Admitting: Internal Medicine

## 2024-05-20 DIAGNOSIS — Z1231 Encounter for screening mammogram for malignant neoplasm of breast: Secondary | ICD-10-CM | POA: Diagnosis not present

## 2024-05-26 DIAGNOSIS — J4520 Mild intermittent asthma, uncomplicated: Secondary | ICD-10-CM | POA: Diagnosis not present

## 2024-05-26 DIAGNOSIS — Z0000 Encounter for general adult medical examination without abnormal findings: Secondary | ICD-10-CM | POA: Diagnosis not present

## 2024-05-26 DIAGNOSIS — I10 Essential (primary) hypertension: Secondary | ICD-10-CM | POA: Diagnosis not present

## 2024-05-26 DIAGNOSIS — E7800 Pure hypercholesterolemia, unspecified: Secondary | ICD-10-CM | POA: Diagnosis not present

## 2024-05-26 DIAGNOSIS — Z6832 Body mass index (BMI) 32.0-32.9, adult: Secondary | ICD-10-CM | POA: Diagnosis not present

## 2024-05-26 DIAGNOSIS — R519 Headache, unspecified: Secondary | ICD-10-CM | POA: Diagnosis not present

## 2024-06-02 ENCOUNTER — Other Ambulatory Visit: Payer: Self-pay

## 2024-06-02 DIAGNOSIS — R0602 Shortness of breath: Secondary | ICD-10-CM | POA: Diagnosis not present

## 2024-06-02 DIAGNOSIS — I10 Essential (primary) hypertension: Secondary | ICD-10-CM | POA: Diagnosis not present

## 2024-06-02 DIAGNOSIS — E7800 Pure hypercholesterolemia, unspecified: Secondary | ICD-10-CM | POA: Diagnosis not present

## 2024-06-02 DIAGNOSIS — G43111 Migraine with aura, intractable, with status migrainosus: Secondary | ICD-10-CM | POA: Diagnosis not present

## 2024-06-02 DIAGNOSIS — J4520 Mild intermittent asthma, uncomplicated: Secondary | ICD-10-CM | POA: Diagnosis not present

## 2024-06-02 DIAGNOSIS — F419 Anxiety disorder, unspecified: Secondary | ICD-10-CM | POA: Diagnosis not present

## 2024-06-02 DIAGNOSIS — Z6831 Body mass index (BMI) 31.0-31.9, adult: Secondary | ICD-10-CM | POA: Diagnosis not present

## 2024-06-02 MED ORDER — BUSPIRONE HCL 15 MG PO TABS
15.0000 mg | ORAL_TABLET | Freq: Two times a day (BID) | ORAL | 1 refills | Status: AC
  Filled 2024-06-02: qty 180, 90d supply, fill #0

## 2024-06-02 MED ORDER — ATORVASTATIN CALCIUM 20 MG PO TABS
20.0000 mg | ORAL_TABLET | ORAL | 1 refills | Status: AC
  Filled 2024-06-02 – 2024-07-27 (×2): qty 36, 84d supply, fill #0

## 2024-06-02 MED ORDER — PANTOPRAZOLE SODIUM 40 MG PO TBEC
40.0000 mg | DELAYED_RELEASE_TABLET | Freq: Every day | ORAL | 1 refills | Status: AC
  Filled 2024-09-01: qty 90, 90d supply, fill #0

## 2024-06-02 MED ORDER — MOUNJARO 10 MG/0.5ML ~~LOC~~ SOAJ
10.0000 mg | SUBCUTANEOUS | 3 refills | Status: AC
  Filled 2024-06-02: qty 2, 28d supply, fill #0
  Filled 2024-07-27: qty 2, 28d supply, fill #1
  Filled 2024-09-01: qty 2, 28d supply, fill #2
  Filled 2024-10-02: qty 2, 28d supply, fill #3

## 2024-06-02 MED ORDER — SUMATRIPTAN SUCCINATE 100 MG PO TABS
100.0000 mg | ORAL_TABLET | ORAL | 2 refills | Status: AC
  Filled 2024-06-02: qty 9, 30d supply, fill #0

## 2024-06-02 MED ORDER — VITAMIN D (ERGOCALCIFEROL) 1.25 MG (50000 UNIT) PO CAPS
50000.0000 [IU] | ORAL_CAPSULE | ORAL | 1 refills | Status: AC
  Filled 2024-06-02: qty 13, 90d supply, fill #0
  Filled 2024-09-01: qty 12, 84d supply, fill #0

## 2024-06-02 MED ORDER — SERTRALINE HCL 100 MG PO TABS
100.0000 mg | ORAL_TABLET | Freq: Every day | ORAL | 1 refills | Status: AC
  Filled 2024-06-02: qty 90, 90d supply, fill #0
  Filled 2024-07-27 – 2024-08-13 (×6): qty 90, 90d supply, fill #1

## 2024-06-02 MED ORDER — ALBUTEROL SULFATE HFA 108 (90 BASE) MCG/ACT IN AERS
2.0000 | INHALATION_SPRAY | Freq: Four times a day (QID) | RESPIRATORY_TRACT | 2 refills | Status: AC | PRN
  Filled 2024-06-02: qty 18, 30d supply, fill #0

## 2024-06-07 ENCOUNTER — Other Ambulatory Visit: Payer: Self-pay

## 2024-06-30 DIAGNOSIS — H524 Presbyopia: Secondary | ICD-10-CM | POA: Diagnosis not present

## 2024-07-01 DIAGNOSIS — L568 Other specified acute skin changes due to ultraviolet radiation: Secondary | ICD-10-CM | POA: Diagnosis not present

## 2024-07-01 DIAGNOSIS — M7918 Myalgia, other site: Secondary | ICD-10-CM | POA: Diagnosis not present

## 2024-07-01 DIAGNOSIS — M2550 Pain in unspecified joint: Secondary | ICD-10-CM | POA: Diagnosis not present

## 2024-07-01 DIAGNOSIS — M791 Myalgia, unspecified site: Secondary | ICD-10-CM | POA: Diagnosis not present

## 2024-07-01 DIAGNOSIS — M7711 Lateral epicondylitis, right elbow: Secondary | ICD-10-CM | POA: Diagnosis not present

## 2024-07-01 DIAGNOSIS — M7700 Medial epicondylitis, unspecified elbow: Secondary | ICD-10-CM | POA: Diagnosis not present

## 2024-07-01 DIAGNOSIS — M7712 Lateral epicondylitis, left elbow: Secondary | ICD-10-CM | POA: Diagnosis not present

## 2024-07-28 ENCOUNTER — Other Ambulatory Visit: Payer: Self-pay

## 2024-07-28 DIAGNOSIS — Z6831 Body mass index (BMI) 31.0-31.9, adult: Secondary | ICD-10-CM | POA: Diagnosis not present

## 2024-07-28 DIAGNOSIS — Z01419 Encounter for gynecological examination (general) (routine) without abnormal findings: Secondary | ICD-10-CM | POA: Diagnosis not present

## 2024-07-28 DIAGNOSIS — N924 Excessive bleeding in the premenopausal period: Secondary | ICD-10-CM | POA: Diagnosis not present

## 2024-08-01 ENCOUNTER — Other Ambulatory Visit: Payer: Self-pay

## 2024-08-01 DIAGNOSIS — M25521 Pain in right elbow: Secondary | ICD-10-CM | POA: Diagnosis not present

## 2024-08-01 DIAGNOSIS — R293 Abnormal posture: Secondary | ICD-10-CM | POA: Diagnosis not present

## 2024-08-01 DIAGNOSIS — M2550 Pain in unspecified joint: Secondary | ICD-10-CM | POA: Diagnosis not present

## 2024-08-01 DIAGNOSIS — M25522 Pain in left elbow: Secondary | ICD-10-CM | POA: Diagnosis not present

## 2024-08-01 DIAGNOSIS — M791 Myalgia, unspecified site: Secondary | ICD-10-CM | POA: Diagnosis not present

## 2024-08-03 ENCOUNTER — Other Ambulatory Visit: Payer: Self-pay

## 2024-08-05 DIAGNOSIS — M25522 Pain in left elbow: Secondary | ICD-10-CM | POA: Diagnosis not present

## 2024-08-05 DIAGNOSIS — M2550 Pain in unspecified joint: Secondary | ICD-10-CM | POA: Diagnosis not present

## 2024-08-05 DIAGNOSIS — M791 Myalgia, unspecified site: Secondary | ICD-10-CM | POA: Diagnosis not present

## 2024-08-05 DIAGNOSIS — M25521 Pain in right elbow: Secondary | ICD-10-CM | POA: Diagnosis not present

## 2024-08-05 DIAGNOSIS — R293 Abnormal posture: Secondary | ICD-10-CM | POA: Diagnosis not present

## 2024-08-12 ENCOUNTER — Other Ambulatory Visit: Payer: Self-pay

## 2024-08-12 DIAGNOSIS — R293 Abnormal posture: Secondary | ICD-10-CM | POA: Diagnosis not present

## 2024-08-12 DIAGNOSIS — M25521 Pain in right elbow: Secondary | ICD-10-CM | POA: Diagnosis not present

## 2024-08-12 DIAGNOSIS — M2550 Pain in unspecified joint: Secondary | ICD-10-CM | POA: Diagnosis not present

## 2024-08-12 DIAGNOSIS — M791 Myalgia, unspecified site: Secondary | ICD-10-CM | POA: Diagnosis not present

## 2024-08-12 DIAGNOSIS — M25522 Pain in left elbow: Secondary | ICD-10-CM | POA: Diagnosis not present

## 2024-09-01 ENCOUNTER — Other Ambulatory Visit: Payer: Self-pay

## 2024-10-02 ENCOUNTER — Other Ambulatory Visit: Payer: Self-pay

## 2024-10-03 ENCOUNTER — Other Ambulatory Visit: Payer: Self-pay
